# Patient Record
Sex: Female | Born: 1965 | Race: White | Hispanic: No | Marital: Married | Smoking: Former smoker
Health system: Southern US, Community
[De-identification: ages and names within clinical notes are randomized; demographics above are authoritative.]

## PROBLEM LIST (undated history)

## (undated) DIAGNOSIS — Z923 Personal history of irradiation: Secondary | ICD-10-CM

## (undated) DIAGNOSIS — R059 Cough, unspecified: Secondary | ICD-10-CM

## (undated) DIAGNOSIS — Z9221 Personal history of antineoplastic chemotherapy: Secondary | ICD-10-CM

## (undated) DIAGNOSIS — C50919 Malignant neoplasm of unspecified site of unspecified female breast: Secondary | ICD-10-CM

## (undated) DIAGNOSIS — Z789 Other specified health status: Secondary | ICD-10-CM

## (undated) DIAGNOSIS — R05 Cough: Secondary | ICD-10-CM

## (undated) DIAGNOSIS — R0981 Nasal congestion: Secondary | ICD-10-CM

## (undated) HISTORY — DX: Malignant neoplasm of unspecified site of unspecified female breast: C50.919

## (undated) HISTORY — PX: BREAST SURGERY: SHX581

## (undated) HISTORY — DX: Nasal congestion: R09.81

## (undated) HISTORY — DX: Cough, unspecified: R05.9

## (undated) HISTORY — DX: Cough: R05

---

## 1999-04-24 HISTORY — PX: LAPAROSCOPIC ENDOMETRIOSIS FULGURATION: SUR769

## 2007-03-11 ENCOUNTER — Ambulatory Visit (HOSPITAL_COMMUNITY): Admission: RE | Admit: 2007-03-11 | Discharge: 2007-03-11 | Payer: Self-pay | Admitting: Obstetrics and Gynecology

## 2008-03-24 ENCOUNTER — Ambulatory Visit (HOSPITAL_COMMUNITY): Admission: RE | Admit: 2008-03-24 | Discharge: 2008-03-24 | Payer: Self-pay | Admitting: Obstetrics and Gynecology

## 2009-03-29 ENCOUNTER — Ambulatory Visit (HOSPITAL_COMMUNITY): Admission: RE | Admit: 2009-03-29 | Discharge: 2009-03-29 | Payer: Self-pay | Admitting: Obstetrics and Gynecology

## 2009-09-26 ENCOUNTER — Encounter: Admission: RE | Admit: 2009-09-26 | Discharge: 2009-12-25 | Payer: Self-pay | Admitting: Orthopedic Surgery

## 2010-04-23 DIAGNOSIS — C50919 Malignant neoplasm of unspecified site of unspecified female breast: Secondary | ICD-10-CM

## 2010-04-23 DIAGNOSIS — Z923 Personal history of irradiation: Secondary | ICD-10-CM

## 2010-04-23 DIAGNOSIS — Z9221 Personal history of antineoplastic chemotherapy: Secondary | ICD-10-CM

## 2010-04-23 HISTORY — DX: Malignant neoplasm of unspecified site of unspecified female breast: C50.919

## 2010-04-23 HISTORY — DX: Personal history of irradiation: Z92.3

## 2010-04-23 HISTORY — DX: Personal history of antineoplastic chemotherapy: Z92.21

## 2010-04-23 HISTORY — PX: BREAST LUMPECTOMY: SHX2

## 2010-05-09 ENCOUNTER — Ambulatory Visit (HOSPITAL_COMMUNITY): Admission: RE | Admit: 2010-05-09 | Payer: Self-pay | Source: Home / Self Care | Admitting: Obstetrics and Gynecology

## 2010-05-14 ENCOUNTER — Encounter: Payer: Self-pay | Admitting: Obstetrics and Gynecology

## 2010-05-24 ENCOUNTER — Encounter: Payer: Self-pay | Admitting: Obstetrics and Gynecology

## 2010-05-31 ENCOUNTER — Other Ambulatory Visit (HOSPITAL_COMMUNITY): Payer: Self-pay | Admitting: Obstetrics and Gynecology

## 2010-05-31 DIAGNOSIS — Z139 Encounter for screening, unspecified: Secondary | ICD-10-CM

## 2010-06-06 ENCOUNTER — Ambulatory Visit (HOSPITAL_COMMUNITY)
Admission: RE | Admit: 2010-06-06 | Discharge: 2010-06-06 | Disposition: A | Discharge status: Home / Self Care | Payer: BC Managed Care – PPO | Source: Ambulatory Visit | Attending: Obstetrics and Gynecology | Admitting: Obstetrics and Gynecology

## 2010-06-06 DIAGNOSIS — Z1231 Encounter for screening mammogram for malignant neoplasm of breast: Secondary | ICD-10-CM | POA: Insufficient documentation

## 2010-06-06 DIAGNOSIS — Z139 Encounter for screening, unspecified: Secondary | ICD-10-CM

## 2010-06-12 ENCOUNTER — Other Ambulatory Visit: Payer: Self-pay | Admitting: Obstetrics and Gynecology

## 2010-06-12 DIAGNOSIS — R928 Other abnormal and inconclusive findings on diagnostic imaging of breast: Secondary | ICD-10-CM

## 2010-06-14 ENCOUNTER — Other Ambulatory Visit (HOSPITAL_COMMUNITY): Payer: Self-pay | Admitting: Obstetrics and Gynecology

## 2010-06-14 ENCOUNTER — Ambulatory Visit (HOSPITAL_COMMUNITY)
Admission: RE | Admit: 2010-06-14 | Discharge: 2010-06-14 | Disposition: A | Discharge status: Home / Self Care | Payer: BC Managed Care – PPO | Source: Ambulatory Visit | Attending: Obstetrics and Gynecology | Admitting: Obstetrics and Gynecology

## 2010-06-14 DIAGNOSIS — R928 Other abnormal and inconclusive findings on diagnostic imaging of breast: Secondary | ICD-10-CM

## 2010-06-21 ENCOUNTER — Ambulatory Visit (HOSPITAL_COMMUNITY)
Admission: RE | Admit: 2010-06-21 | Discharge: 2010-06-21 | Disposition: A | Discharge status: Home / Self Care | Payer: BC Managed Care – PPO | Source: Ambulatory Visit | Attending: Obstetrics and Gynecology | Admitting: Obstetrics and Gynecology

## 2010-06-21 ENCOUNTER — Other Ambulatory Visit (HOSPITAL_COMMUNITY): Payer: Self-pay | Admitting: Obstetrics and Gynecology

## 2010-06-21 ENCOUNTER — Ambulatory Visit (HOSPITAL_COMMUNITY): Payer: BC Managed Care – PPO

## 2010-06-21 DIAGNOSIS — R928 Other abnormal and inconclusive findings on diagnostic imaging of breast: Secondary | ICD-10-CM

## 2010-06-21 DIAGNOSIS — N6009 Solitary cyst of unspecified breast: Secondary | ICD-10-CM | POA: Insufficient documentation

## 2010-08-01 ENCOUNTER — Other Ambulatory Visit (HOSPITAL_COMMUNITY): Payer: Self-pay | Admitting: Obstetrics and Gynecology

## 2010-08-01 DIAGNOSIS — N63 Unspecified lump in unspecified breast: Secondary | ICD-10-CM

## 2010-08-09 ENCOUNTER — Ambulatory Visit (HOSPITAL_COMMUNITY)
Admission: RE | Admit: 2010-08-09 | Discharge: 2010-08-09 | Disposition: A | Discharge status: Home / Self Care | Payer: BC Managed Care – PPO | Source: Ambulatory Visit | Attending: Obstetrics and Gynecology | Admitting: Obstetrics and Gynecology

## 2010-08-09 ENCOUNTER — Other Ambulatory Visit (HOSPITAL_COMMUNITY): Payer: Self-pay | Admitting: Obstetrics and Gynecology

## 2010-08-09 ENCOUNTER — Other Ambulatory Visit: Payer: Self-pay | Admitting: Radiology

## 2010-08-09 DIAGNOSIS — IMO0002 Reserved for concepts with insufficient information to code with codable children: Secondary | ICD-10-CM

## 2010-08-09 DIAGNOSIS — C50919 Malignant neoplasm of unspecified site of unspecified female breast: Secondary | ICD-10-CM | POA: Insufficient documentation

## 2010-08-09 DIAGNOSIS — N63 Unspecified lump in unspecified breast: Secondary | ICD-10-CM

## 2010-08-10 ENCOUNTER — Other Ambulatory Visit: Payer: Self-pay | Admitting: Obstetrics and Gynecology

## 2010-08-10 ENCOUNTER — Ambulatory Visit
Admission: RE | Admit: 2010-08-10 | Discharge: 2010-08-10 | Disposition: A | Discharge status: Home / Self Care | Payer: BC Managed Care – PPO | Source: Ambulatory Visit | Attending: Obstetrics and Gynecology | Admitting: Obstetrics and Gynecology

## 2010-08-10 DIAGNOSIS — C50912 Malignant neoplasm of unspecified site of left female breast: Secondary | ICD-10-CM

## 2010-08-10 DIAGNOSIS — R928 Other abnormal and inconclusive findings on diagnostic imaging of breast: Secondary | ICD-10-CM

## 2010-08-15 ENCOUNTER — Ambulatory Visit
Admission: RE | Admit: 2010-08-15 | Discharge: 2010-08-15 | Disposition: A | Discharge status: Home / Self Care | Payer: BC Managed Care – PPO | Source: Ambulatory Visit | Attending: Obstetrics and Gynecology | Admitting: Obstetrics and Gynecology

## 2010-08-15 DIAGNOSIS — C50912 Malignant neoplasm of unspecified site of left female breast: Secondary | ICD-10-CM

## 2010-08-15 MED ORDER — GADOBENATE DIMEGLUMINE 529 MG/ML IV SOLN
18.0000 mL | Freq: Once | INTRAVENOUS | Status: AC | PRN
  Administered 2010-08-15: 18 mL via INTRAVENOUS

## 2010-08-16 ENCOUNTER — Encounter (HOSPITAL_BASED_OUTPATIENT_CLINIC_OR_DEPARTMENT_OTHER): Payer: BC Managed Care – PPO | Admitting: Oncology

## 2010-08-16 ENCOUNTER — Other Ambulatory Visit: Payer: Self-pay | Admitting: Oncology

## 2010-08-16 DIAGNOSIS — C50319 Malignant neoplasm of lower-inner quadrant of unspecified female breast: Secondary | ICD-10-CM

## 2010-08-16 LAB — CBC WITH DIFFERENTIAL/PLATELET
BASO%: 0.2 % (ref 0.0–2.0)
EOS%: 2.2 % (ref 0.0–7.0)
HGB: 14.3 g/dL (ref 11.6–15.9)
MCH: 31.3 pg (ref 25.1–34.0)
MCHC: 34.2 g/dL (ref 31.5–36.0)
MCV: 91.5 fL (ref 79.5–101.0)
MONO%: 5.7 % (ref 0.0–14.0)
RBC: 4.56 10*6/uL (ref 3.70–5.45)
RDW: 13.3 % (ref 11.2–14.5)
lymph#: 2.1 10*3/uL (ref 0.9–3.3)

## 2010-08-16 LAB — COMPREHENSIVE METABOLIC PANEL
ALT: 31 U/L (ref 0–35)
AST: 22 U/L (ref 0–37)
Albumin: 4 g/dL (ref 3.5–5.2)
Alkaline Phosphatase: 54 U/L (ref 39–117)
BUN: 10 mg/dL (ref 6–23)
Calcium: 9.2 mg/dL (ref 8.4–10.5)
Chloride: 106 mEq/L (ref 96–112)
Potassium: 4 mEq/L (ref 3.5–5.3)
Sodium: 139 mEq/L (ref 135–145)

## 2010-08-17 DIAGNOSIS — H409 Unspecified glaucoma: Secondary | ICD-10-CM

## 2010-08-17 DIAGNOSIS — Z170 Estrogen receptor positive status [ER+]: Secondary | ICD-10-CM

## 2010-08-17 DIAGNOSIS — C50312 Malignant neoplasm of lower-inner quadrant of left female breast: Secondary | ICD-10-CM | POA: Insufficient documentation

## 2010-08-18 ENCOUNTER — Other Ambulatory Visit: Payer: Self-pay | Admitting: Surgery

## 2010-08-18 ENCOUNTER — Other Ambulatory Visit (HOSPITAL_COMMUNITY): Payer: Self-pay | Admitting: Surgery

## 2010-08-18 DIAGNOSIS — C50919 Malignant neoplasm of unspecified site of unspecified female breast: Secondary | ICD-10-CM

## 2010-08-18 DIAGNOSIS — C50912 Malignant neoplasm of unspecified site of left female breast: Secondary | ICD-10-CM

## 2010-08-24 ENCOUNTER — Other Ambulatory Visit (HOSPITAL_COMMUNITY): Payer: Self-pay | Admitting: Surgery

## 2010-08-24 ENCOUNTER — Encounter (HOSPITAL_COMMUNITY)
Admission: RE | Admit: 2010-08-24 | Discharge: 2010-08-24 | Disposition: A | Discharge status: Home / Self Care | Payer: BC Managed Care – PPO | Source: Ambulatory Visit | Attending: Surgery | Admitting: Surgery

## 2010-08-24 DIAGNOSIS — C50919 Malignant neoplasm of unspecified site of unspecified female breast: Secondary | ICD-10-CM

## 2010-08-24 LAB — CBC
HCT: 44.2 % (ref 36.0–46.0)
Hemoglobin: 15.2 g/dL — ABNORMAL HIGH (ref 12.0–15.0)
MCH: 31 pg (ref 26.0–34.0)
MCHC: 34.4 g/dL (ref 30.0–36.0)
RDW: 12.9 % (ref 11.5–15.5)

## 2010-08-24 LAB — COMPREHENSIVE METABOLIC PANEL
BUN: 12 mg/dL (ref 6–23)
CO2: 26 mEq/L (ref 19–32)
Calcium: 10.1 mg/dL (ref 8.4–10.5)
Chloride: 103 mEq/L (ref 96–112)
Creatinine, Ser: 0.8 mg/dL (ref 0.4–1.2)
GFR calc non Af Amer: >60 mL/min (ref >60)
Total Bilirubin: 0.6 mg/dL (ref 0.3–1.2)

## 2010-08-24 LAB — DIFFERENTIAL
Basophils Absolute: 0 10*3/uL (ref 0.0–0.1)
Basophils Relative: 0 % (ref 0–1)
Eosinophils Relative: 2 % (ref 0–5)
Monocytes Absolute: 0.6 10*3/uL (ref 0.1–1.0)
Monocytes Relative: 7 % (ref 3–12)
Neutro Abs: 5.5 10*3/uL (ref 1.7–7.7)

## 2010-08-24 LAB — HCG, SERUM, QUALITATIVE: Preg, Serum: NEGATIVE

## 2010-08-24 LAB — SURGICAL PCR SCREEN: MRSA, PCR: NEGATIVE

## 2010-08-28 ENCOUNTER — Encounter: Payer: BC Managed Care – PPO | Admitting: Genetic Counselor

## 2010-08-29 ENCOUNTER — Ambulatory Visit
Admission: RE | Admit: 2010-08-29 | Discharge: 2010-08-29 | Disposition: A | Discharge status: Home / Self Care | Payer: BC Managed Care – PPO | Source: Ambulatory Visit | Attending: Surgery | Admitting: Surgery

## 2010-08-29 ENCOUNTER — Other Ambulatory Visit: Payer: Self-pay | Admitting: Surgery

## 2010-08-29 ENCOUNTER — Ambulatory Visit (HOSPITAL_COMMUNITY): Payer: BC Managed Care – PPO

## 2010-08-29 ENCOUNTER — Ambulatory Visit (HOSPITAL_COMMUNITY)
Admission: RE | Admit: 2010-08-29 | Discharge: 2010-08-29 | Disposition: A | Discharge status: Home / Self Care | Payer: BC Managed Care – PPO | Source: Ambulatory Visit | Attending: Surgery | Admitting: Surgery

## 2010-08-29 DIAGNOSIS — C50919 Malignant neoplasm of unspecified site of unspecified female breast: Secondary | ICD-10-CM

## 2010-08-29 DIAGNOSIS — C50912 Malignant neoplasm of unspecified site of left female breast: Secondary | ICD-10-CM

## 2010-08-29 DIAGNOSIS — Z01810 Encounter for preprocedural cardiovascular examination: Secondary | ICD-10-CM | POA: Insufficient documentation

## 2010-08-29 MED ORDER — TECHNETIUM TC 99M SULFUR COLLOID FILTERED
1.0000 | Freq: Once | INTRAVENOUS | Status: AC | PRN
  Administered 2010-08-29: 1 via INTRADERMAL

## 2010-09-01 NOTE — Op Note (Signed)
NAMEKARLEA, MCKIBBIN               ACCOUNT NO.:  [id number]  MEDICAL RECORD NO.:  [id number]           PATIENT TYPE:  LOCATION:                                 FACILITY:  PHYSICIAN:  Clovis Pu. Ameir Faria, M.D.DATE OF BIRTH:  1966/02/27  DATE OF PROCEDURE:  08/29/2010 DATE OF DISCHARGE:                              OPERATIVE REPORT   PREOPERATIVE DIAGNOSIS:  Left breast cancer.  POSTOPERATIVE DIAGNOSIS:  Left breast cancer.  PROCEDURE: 1. Left breast needle-localized partial mastectomy. 2. Left axillary sentinel lymph node mapping. 3. Placement of right subclavian 8-French Power Port-A-Cath with     fluoroscopic guidance.  SURGEON:  Maisie Fus A. Gerrell Tabet, MD.  ANESTHESIA:  LMA with 0.25% Sensorcaine local.  ESTIMATED BLOOD LOSS:  Minimal.  SPECIMENS:  None.  INDICATIONS FOR PROCEDURE:  The patient is a 45 year old female with left breast cancer.  She elected for breast conserving treatment which will require lumpectomy, sentinel lymph node mapping, postop chemotherapy, radiation therapy after being seen in the Multidisciplinary Clinic.  We discussed procedure at great length with her as well as potential treatment options.  Risk of bleeding, infection, arm stiffness and soreness in the left side, swelling of both incisions, potential pneumothorax, hemothorax, pericardial tamponade, catheter migration, arterial puncture, major venous injury, major arterial injury, major nerve injury, perforation esophagus and trachea were all discussed potential risk of Port-A-Cath.  We also discussed catheters can migrate to fragment, become malfunction, become infect and have to be removed as well.  I discussed all the above potential risks of procedures and discussion of options of both operative and nonoperative treatments of this condition.  She agreed to proceed with the above.  She will need postop chemotherapy.  Therefore, Port-A-cath was recommended.  DESCRIPTION OF PROCEDURE:  After  undergoing left breast needle localization, she was seen by Nuclear Medicine in the holding area and left breast was injected with technetium sulfur colloid.  Both sides were appropriately labeled.  The right side was for Port-A-Cath, left side was for lumpectomy, sentinel node.  Questions answered.  She was then taken back to the operating room.  After induction of general esthesia, upper chest region was prepped and draped in sterile fashion. A 4 mL of methylene blue dye were injected in a subareolar position in the left breast for sentinel lymph node mapping.  Time-out was done before this.  She received preoperative antibiotics.  Port-A-Cath was done first.  The patient was placed in Trendelenburg.  Right subclavian vein was cannulated with a needle with return of dark nonpulsatile blood with two attempts.  Wire was fitted through the needle without difficulty and fluoroscopic guidance showed the wire going down the superior vena cava.  We removed the needle.  Small stab incision was made at the wire insertion site.  A 3-cm incision was made below the wire insertion site.  Small pocket was made on the chest wall.  This was done with my finger bluntly.  We then brought an 8-French Power Port catheter on the field.  We attached it, flushed it and then tunneled from the lower incision to the upper incision.  With the  patient back in Trendelenburg, I passed the dilator over the wire moving the wire to-and- fro to dilate the tract.  I then placed the dilator introducer complex advancing it carefully of the wire with fluoroscopic guidance, moving the wire to-and-fro.  Once in position, I removed the wire and dilator leaving the introducer in place with the patient Trendelenburg. Catheter was put to the peel-away sheath and it was peeled away without difficulty.  Tip appeared to be in the mid superior vena cava it look like.  No obvious pneumothorax or hemothorax by fluoroscopy.  I  then secured the catheter to the chest wall with two 2-0 Prolene sutures.  I then drew back on the port, got dark nonpulsatile blood and then flushed it with heparinized saline.  I then put 5 mL of 100 units/mL of heparinized saline within the port.  Lower incision was closed with 3-0 Vicryl and 4-0 Monocryl.  Small stab incision where the catheter was inserted closed with 3-0 Monocryl.  Next, left breast was done.  Lumpectomy was done first.  The tumor in the medial left breast.  Transverse incision was made in the wire toward the nipple.  All tissue around the wires excised.  The anterior margin was closed and I took some additional anterior margin that the tumor seemed quite small.  Cavity was irrigated.  I took the tumor all way down to the pectoralis fascia and took the fascia with leaving the muscle behind.  The NeoProbe was then used to identify the hot spot in left axilla.  Small incision was made in the right axilla.  Dissection was carried down and 4 hot blue sentinel nodes identified and excised. The background counts less than 10% of the actual tumor count which well over 6000.  This cavity was irrigated.  Small piece of Surgicel was placed with excellent hemostasis.  Cavities closed with a layer of 3-0 Vicryl and 4-0 Monocryl.  The lumpectomy cavity was examined, found to be hemostatic after irrigation and closure of the deep layer with 3-0 Vicryl for Monocryl.  Dermabond was applied to all incisions.  All final counts of sponge, needle and instruments were found to be correct at this portion of case.  The patient extubated, taken to recovery room in satisfactory condition with all final counts being correct.     Marrah Vanevery A. Inmer Nix, M.D.     TAC/MEDQ  D:  08/29/2010  T:  08/30/2010  Job:  [id number]  cc:   Drue Second, M.D. Lurline Hare, M.D.  Electronically Signed by Harriette Bouillon M.D. on 09/01/2010 11:16:17 AM

## 2010-09-05 ENCOUNTER — Encounter (HOSPITAL_BASED_OUTPATIENT_CLINIC_OR_DEPARTMENT_OTHER): Payer: BC Managed Care – PPO | Admitting: Oncology

## 2010-09-05 ENCOUNTER — Other Ambulatory Visit: Payer: Self-pay | Admitting: Oncology

## 2010-09-05 DIAGNOSIS — C50319 Malignant neoplasm of lower-inner quadrant of unspecified female breast: Secondary | ICD-10-CM

## 2010-09-05 LAB — COMPREHENSIVE METABOLIC PANEL
Albumin: 4.2 g/dL (ref 3.5–5.2)
CO2: 19 mEq/L (ref 19–32)
Calcium: 9.5 mg/dL (ref 8.4–10.5)
Chloride: 105 mEq/L (ref 96–112)
Glucose, Bld: 102 mg/dL — ABNORMAL HIGH (ref 70–99)
Potassium: 4.1 mEq/L (ref 3.5–5.3)
Sodium: 137 mEq/L (ref 135–145)
Total Bilirubin: 0.6 mg/dL (ref 0.3–1.2)
Total Protein: 6.7 g/dL (ref 6.0–8.3)

## 2010-09-05 LAB — CBC WITH DIFFERENTIAL/PLATELET
Basophils Absolute: 0 10*3/uL (ref 0.0–0.1)
Eosinophils Absolute: 0.2 10*3/uL (ref 0.0–0.5)
HGB: 14.1 g/dL (ref 11.6–15.9)
MCV: 89.2 fL (ref 79.5–101.0)
MONO#: 0.7 10*3/uL (ref 0.1–0.9)
MONO%: 8.6 % (ref 0.0–14.0)
NEUT#: 4.9 10*3/uL (ref 1.5–6.5)
RDW: 13 % (ref 11.2–14.5)

## 2010-09-11 ENCOUNTER — Encounter: Payer: BC Managed Care – PPO | Admitting: Genetic Counselor

## 2010-09-14 ENCOUNTER — Ambulatory Visit (HOSPITAL_COMMUNITY): Payer: BC Managed Care – PPO

## 2010-09-27 ENCOUNTER — Other Ambulatory Visit (HOSPITAL_COMMUNITY): Payer: BC Managed Care – PPO

## 2010-10-03 ENCOUNTER — Other Ambulatory Visit (HOSPITAL_COMMUNITY): Payer: BC Managed Care – PPO

## 2010-10-05 ENCOUNTER — Ambulatory Visit (HOSPITAL_COMMUNITY)
Admission: RE | Admit: 2010-10-05 | Discharge: 2010-10-05 | Disposition: A | Discharge status: Home / Self Care | Payer: BC Managed Care – PPO | Source: Ambulatory Visit | Attending: Oncology | Admitting: Oncology

## 2010-10-05 DIAGNOSIS — Z5111 Encounter for antineoplastic chemotherapy: Secondary | ICD-10-CM

## 2010-10-05 DIAGNOSIS — Z01818 Encounter for other preprocedural examination: Secondary | ICD-10-CM | POA: Insufficient documentation

## 2010-10-05 DIAGNOSIS — C50919 Malignant neoplasm of unspecified site of unspecified female breast: Secondary | ICD-10-CM | POA: Insufficient documentation

## 2010-10-10 ENCOUNTER — Encounter (HOSPITAL_BASED_OUTPATIENT_CLINIC_OR_DEPARTMENT_OTHER): Payer: BC Managed Care – PPO | Admitting: Oncology

## 2010-10-10 ENCOUNTER — Other Ambulatory Visit: Payer: Self-pay | Admitting: Oncology

## 2010-10-10 DIAGNOSIS — Z170 Estrogen receptor positive status [ER+]: Secondary | ICD-10-CM

## 2010-10-10 DIAGNOSIS — Z5111 Encounter for antineoplastic chemotherapy: Secondary | ICD-10-CM

## 2010-10-10 DIAGNOSIS — C50319 Malignant neoplasm of lower-inner quadrant of unspecified female breast: Secondary | ICD-10-CM

## 2010-10-10 DIAGNOSIS — G4700 Insomnia, unspecified: Secondary | ICD-10-CM

## 2010-10-10 LAB — COMPREHENSIVE METABOLIC PANEL
ALT: 34 U/L (ref 0–35)
AST: 22 U/L (ref 0–37)
Albumin: 4.3 g/dL (ref 3.5–5.2)
Alkaline Phosphatase: 59 U/L (ref 39–117)
BUN: 11 mg/dL (ref 6–23)
CO2: 18 mEq/L — ABNORMAL LOW (ref 19–32)
Calcium: 9.1 mg/dL (ref 8.4–10.5)
Chloride: 108 mEq/L (ref 96–112)
Creatinine, Ser: 0.81 mg/dL (ref 0.50–1.10)
Glucose, Bld: 98 mg/dL (ref 70–99)
Potassium: 4.1 mEq/L (ref 3.5–5.3)
Sodium: 137 mEq/L (ref 135–145)
Total Bilirubin: 0.4 mg/dL (ref 0.3–1.2)
Total Protein: 6.9 g/dL (ref 6.0–8.3)

## 2010-10-10 LAB — CBC WITH DIFFERENTIAL/PLATELET
Eosinophils Absolute: 0.2 10*3/uL (ref 0.0–0.5)
MCHC: 34.1 g/dL (ref 31.5–36.0)
MONO#: 0.6 10*3/uL (ref 0.1–0.9)
NEUT#: 3.9 10*3/uL (ref 1.5–6.5)
RBC: 4.61 10*6/uL (ref 3.70–5.45)
RDW: 13.3 % (ref 11.2–14.5)
WBC: 6.4 10*3/uL (ref 3.9–10.3)

## 2010-10-11 ENCOUNTER — Encounter (HOSPITAL_BASED_OUTPATIENT_CLINIC_OR_DEPARTMENT_OTHER): Payer: BC Managed Care – PPO | Admitting: Oncology

## 2010-10-11 DIAGNOSIS — Z170 Estrogen receptor positive status [ER+]: Secondary | ICD-10-CM

## 2010-10-11 DIAGNOSIS — Z5111 Encounter for antineoplastic chemotherapy: Secondary | ICD-10-CM

## 2010-10-11 DIAGNOSIS — C50319 Malignant neoplasm of lower-inner quadrant of unspecified female breast: Secondary | ICD-10-CM

## 2010-10-17 ENCOUNTER — Other Ambulatory Visit: Payer: Self-pay | Admitting: Oncology

## 2010-10-17 ENCOUNTER — Encounter (HOSPITAL_BASED_OUTPATIENT_CLINIC_OR_DEPARTMENT_OTHER): Payer: BC Managed Care – PPO | Admitting: Oncology

## 2010-10-17 DIAGNOSIS — Z170 Estrogen receptor positive status [ER+]: Secondary | ICD-10-CM

## 2010-10-17 DIAGNOSIS — C50319 Malignant neoplasm of lower-inner quadrant of unspecified female breast: Secondary | ICD-10-CM

## 2010-10-17 DIAGNOSIS — D0590 Unspecified type of carcinoma in situ of unspecified breast: Secondary | ICD-10-CM

## 2010-10-17 DIAGNOSIS — H409 Unspecified glaucoma: Secondary | ICD-10-CM

## 2010-10-17 LAB — CBC WITH DIFFERENTIAL/PLATELET
Basophils Absolute: 0 10*3/uL (ref 0.0–0.1)
EOS%: 6.9 % (ref 0.0–7.0)
HCT: 36.1 % (ref 34.8–46.6)
HGB: 12.7 g/dL (ref 11.6–15.9)
MCH: 31.8 pg (ref 25.1–34.0)
MONO#: 0.1 10*3/uL (ref 0.1–0.9)
NEUT%: 13 % — ABNORMAL LOW (ref 38.4–76.8)
lymph#: 0.9 10*3/uL (ref 0.9–3.3)

## 2010-10-17 LAB — BASIC METABOLIC PANEL
BUN: 12 mg/dL (ref 6–23)
CO2: 21 mEq/L (ref 19–32)
Chloride: 106 mEq/L (ref 96–112)
Creatinine, Ser: 0.75 mg/dL (ref 0.50–1.10)

## 2010-10-24 ENCOUNTER — Encounter (HOSPITAL_BASED_OUTPATIENT_CLINIC_OR_DEPARTMENT_OTHER): Payer: BC Managed Care – PPO | Admitting: Oncology

## 2010-10-24 ENCOUNTER — Other Ambulatory Visit: Payer: Self-pay | Admitting: Oncology

## 2010-10-24 DIAGNOSIS — H409 Unspecified glaucoma: Secondary | ICD-10-CM

## 2010-10-24 DIAGNOSIS — C50319 Malignant neoplasm of lower-inner quadrant of unspecified female breast: Secondary | ICD-10-CM

## 2010-10-24 DIAGNOSIS — Z5111 Encounter for antineoplastic chemotherapy: Secondary | ICD-10-CM

## 2010-10-24 DIAGNOSIS — Z170 Estrogen receptor positive status [ER+]: Secondary | ICD-10-CM

## 2010-10-24 LAB — CBC WITH DIFFERENTIAL/PLATELET
EOS%: 0.4 % (ref 0.0–7.0)
MCH: 31.5 pg (ref 25.1–34.0)
MCHC: 34.6 g/dL (ref 31.5–36.0)
MCV: 91.1 fL (ref 79.5–101.0)
MONO%: 1.4 % (ref 0.0–14.0)
RBC: 4.13 10*6/uL (ref 3.70–5.45)
RDW: 13.3 % (ref 11.2–14.5)

## 2010-10-24 LAB — COMPREHENSIVE METABOLIC PANEL
AST: 14 U/L (ref 0–37)
Albumin: 4.2 g/dL (ref 3.5–5.2)
Alkaline Phosphatase: 74 U/L (ref 39–117)
BUN: 12 mg/dL (ref 6–23)
Potassium: 4.1 mEq/L (ref 3.5–5.3)
Sodium: 139 mEq/L (ref 135–145)
Total Protein: 6.5 g/dL (ref 6.0–8.3)

## 2010-10-25 ENCOUNTER — Encounter (HOSPITAL_BASED_OUTPATIENT_CLINIC_OR_DEPARTMENT_OTHER): Payer: BC Managed Care – PPO | Admitting: Oncology

## 2010-10-25 DIAGNOSIS — Z5111 Encounter for antineoplastic chemotherapy: Secondary | ICD-10-CM

## 2010-10-25 DIAGNOSIS — C50319 Malignant neoplasm of lower-inner quadrant of unspecified female breast: Secondary | ICD-10-CM

## 2010-10-25 DIAGNOSIS — D702 Other drug-induced agranulocytosis: Secondary | ICD-10-CM

## 2010-10-31 ENCOUNTER — Encounter (HOSPITAL_BASED_OUTPATIENT_CLINIC_OR_DEPARTMENT_OTHER): Payer: BC Managed Care – PPO | Admitting: Oncology

## 2010-10-31 ENCOUNTER — Other Ambulatory Visit: Payer: Self-pay | Admitting: Oncology

## 2010-10-31 DIAGNOSIS — H409 Unspecified glaucoma: Secondary | ICD-10-CM

## 2010-10-31 DIAGNOSIS — C50319 Malignant neoplasm of lower-inner quadrant of unspecified female breast: Secondary | ICD-10-CM

## 2010-10-31 DIAGNOSIS — Z170 Estrogen receptor positive status [ER+]: Secondary | ICD-10-CM

## 2010-10-31 LAB — BASIC METABOLIC PANEL
BUN: 12 mg/dL (ref 6–23)
Calcium: 9.5 mg/dL (ref 8.4–10.5)
Creatinine, Ser: 0.8 mg/dL (ref 0.50–1.10)
Potassium: 4.2 mEq/L (ref 3.5–5.3)

## 2010-10-31 LAB — CBC WITH DIFFERENTIAL/PLATELET
Basophils Absolute: 0.1 10*3/uL (ref 0.0–0.1)
EOS%: 1 % (ref 0.0–7.0)
HGB: 12.3 g/dL (ref 11.6–15.9)
LYMPH%: 67.3 % — ABNORMAL HIGH (ref 14.0–49.7)
MCH: 30.2 pg (ref 25.1–34.0)
MCV: 88.9 fL (ref 79.5–101.0)
MONO%: 11.5 % (ref 0.0–14.0)
Platelets: 210 10*3/uL (ref 145–400)
RDW: 12.9 % (ref 11.2–14.5)

## 2010-11-07 ENCOUNTER — Other Ambulatory Visit: Payer: Self-pay | Admitting: Oncology

## 2010-11-07 ENCOUNTER — Encounter (HOSPITAL_BASED_OUTPATIENT_CLINIC_OR_DEPARTMENT_OTHER): Payer: BC Managed Care – PPO | Admitting: Oncology

## 2010-11-07 DIAGNOSIS — Z170 Estrogen receptor positive status [ER+]: Secondary | ICD-10-CM

## 2010-11-07 DIAGNOSIS — H409 Unspecified glaucoma: Secondary | ICD-10-CM

## 2010-11-07 DIAGNOSIS — C50319 Malignant neoplasm of lower-inner quadrant of unspecified female breast: Secondary | ICD-10-CM

## 2010-11-07 DIAGNOSIS — Z5111 Encounter for antineoplastic chemotherapy: Secondary | ICD-10-CM

## 2010-11-07 DIAGNOSIS — H10239 Serous conjunctivitis, except viral, unspecified eye: Secondary | ICD-10-CM

## 2010-11-07 LAB — CBC WITH DIFFERENTIAL/PLATELET
BASO%: 0.6 % (ref 0.0–2.0)
Eosinophils Absolute: 0 10*3/uL (ref 0.0–0.5)
MONO#: 1.1 10*3/uL — ABNORMAL HIGH (ref 0.1–0.9)
NEUT#: 5.8 10*3/uL (ref 1.5–6.5)
Platelets: 197 10*3/uL (ref 145–400)
RBC: 4.27 10*6/uL (ref 3.70–5.45)
RDW: 13.6 % (ref 11.2–14.5)
WBC: 8.3 10*3/uL (ref 3.9–10.3)
lymph#: 1.4 10*3/uL (ref 0.9–3.3)

## 2010-11-07 LAB — COMPREHENSIVE METABOLIC PANEL
ALT: 30 U/L (ref 0–35)
Albumin: 4.1 g/dL (ref 3.5–5.2)
CO2: 21 mEq/L (ref 19–32)
Chloride: 106 mEq/L (ref 96–112)
Glucose, Bld: 90 mg/dL (ref 70–99)
Potassium: 4 mEq/L (ref 3.5–5.3)
Sodium: 138 mEq/L (ref 135–145)
Total Protein: 6.8 g/dL (ref 6.0–8.3)

## 2010-11-08 ENCOUNTER — Encounter (HOSPITAL_BASED_OUTPATIENT_CLINIC_OR_DEPARTMENT_OTHER): Payer: BC Managed Care – PPO | Admitting: Oncology

## 2010-11-08 DIAGNOSIS — C50319 Malignant neoplasm of lower-inner quadrant of unspecified female breast: Secondary | ICD-10-CM

## 2010-11-08 DIAGNOSIS — Z5189 Encounter for other specified aftercare: Secondary | ICD-10-CM

## 2010-11-14 ENCOUNTER — Other Ambulatory Visit: Payer: Self-pay | Admitting: Oncology

## 2010-11-14 ENCOUNTER — Encounter (HOSPITAL_BASED_OUTPATIENT_CLINIC_OR_DEPARTMENT_OTHER): Payer: BC Managed Care – PPO | Admitting: Oncology

## 2010-11-14 ENCOUNTER — Ambulatory Visit (HOSPITAL_COMMUNITY)
Admission: RE | Admit: 2010-11-14 | Discharge: 2010-11-14 | Disposition: A | Discharge status: Home / Self Care | Payer: BC Managed Care – PPO | Source: Ambulatory Visit | Attending: Oncology | Admitting: Oncology

## 2010-11-14 DIAGNOSIS — H409 Unspecified glaucoma: Secondary | ICD-10-CM

## 2010-11-14 DIAGNOSIS — Z170 Estrogen receptor positive status [ER+]: Secondary | ICD-10-CM

## 2010-11-14 DIAGNOSIS — H10239 Serous conjunctivitis, except viral, unspecified eye: Secondary | ICD-10-CM

## 2010-11-14 DIAGNOSIS — Z95828 Presence of other vascular implants and grafts: Secondary | ICD-10-CM

## 2010-11-14 DIAGNOSIS — C50919 Malignant neoplasm of unspecified site of unspecified female breast: Secondary | ICD-10-CM | POA: Insufficient documentation

## 2010-11-14 DIAGNOSIS — C50319 Malignant neoplasm of lower-inner quadrant of unspecified female breast: Secondary | ICD-10-CM

## 2010-11-14 DIAGNOSIS — Z452 Encounter for adjustment and management of vascular access device: Secondary | ICD-10-CM | POA: Insufficient documentation

## 2010-11-14 LAB — CBC WITH DIFFERENTIAL/PLATELET
BASO%: 2.5 % — ABNORMAL HIGH (ref 0.0–2.0)
Eosinophils Absolute: 0 10*3/uL (ref 0.0–0.5)
LYMPH%: 37.6 % (ref 14.0–49.7)
MCHC: 35.2 g/dL (ref 31.5–36.0)
MONO#: 0.1 10*3/uL (ref 0.1–0.9)
MONO%: 6.2 % (ref 0.0–14.0)
NEUT#: 0.8 10*3/uL — ABNORMAL LOW (ref 1.5–6.5)
RBC: 3.72 10*6/uL (ref 3.70–5.45)
RDW: 13.6 % (ref 11.2–14.5)
WBC: 1.6 10*3/uL — ABNORMAL LOW (ref 3.9–10.3)

## 2010-11-14 LAB — BASIC METABOLIC PANEL
Calcium: 9.7 mg/dL (ref 8.4–10.5)
Glucose, Bld: 97 mg/dL (ref 70–99)
Potassium: 4.3 mEq/L (ref 3.5–5.3)
Sodium: 141 mEq/L (ref 135–145)

## 2010-11-21 ENCOUNTER — Other Ambulatory Visit: Payer: Self-pay | Admitting: Oncology

## 2010-11-21 ENCOUNTER — Encounter (HOSPITAL_BASED_OUTPATIENT_CLINIC_OR_DEPARTMENT_OTHER): Payer: BC Managed Care – PPO

## 2010-11-21 DIAGNOSIS — H409 Unspecified glaucoma: Secondary | ICD-10-CM

## 2010-11-21 DIAGNOSIS — H10239 Serous conjunctivitis, except viral, unspecified eye: Secondary | ICD-10-CM

## 2010-11-21 DIAGNOSIS — C50319 Malignant neoplasm of lower-inner quadrant of unspecified female breast: Secondary | ICD-10-CM

## 2010-11-21 DIAGNOSIS — Z170 Estrogen receptor positive status [ER+]: Secondary | ICD-10-CM

## 2010-11-21 DIAGNOSIS — Z5111 Encounter for antineoplastic chemotherapy: Secondary | ICD-10-CM

## 2010-11-21 LAB — CBC WITH DIFFERENTIAL (CANCER CENTER ONLY)
BASO#: 0.1 10*3/uL (ref 0.0–0.2)
Eosinophils Absolute: 0 10*3/uL (ref 0.0–0.5)
HCT: 36.9 % (ref 34.8–46.6)
HGB: 12.9 g/dL (ref 11.6–15.9)
LYMPH#: 1.5 10*3/uL (ref 0.9–3.3)
LYMPH%: 14.1 % (ref 14.0–48.0)
MCV: 91 fL (ref 81–101)
MONO#: 1 10*3/uL — ABNORMAL HIGH (ref 0.1–0.9)
NEUT%: 75.5 % (ref 39.6–80.0)
RDW: 14.9 % (ref 11.1–15.7)
WBC: 10.3 10*3/uL — ABNORMAL HIGH (ref 3.9–10.0)

## 2010-11-21 LAB — COMPREHENSIVE METABOLIC PANEL
ALT: 30 U/L (ref 0–35)
BUN: 11 mg/dL (ref 6–23)
CO2: 21 mEq/L (ref 19–32)
Calcium: 9.5 mg/dL (ref 8.4–10.5)
Chloride: 105 mEq/L (ref 96–112)
Creatinine, Ser: 0.77 mg/dL (ref 0.50–1.10)
Glucose, Bld: 95 mg/dL (ref 70–99)
Total Bilirubin: 0.3 mg/dL (ref 0.3–1.2)

## 2010-11-22 ENCOUNTER — Encounter (HOSPITAL_BASED_OUTPATIENT_CLINIC_OR_DEPARTMENT_OTHER): Payer: BC Managed Care – PPO | Admitting: Oncology

## 2010-11-22 DIAGNOSIS — Z5189 Encounter for other specified aftercare: Secondary | ICD-10-CM

## 2010-11-22 DIAGNOSIS — C50319 Malignant neoplasm of lower-inner quadrant of unspecified female breast: Secondary | ICD-10-CM

## 2010-11-28 ENCOUNTER — Other Ambulatory Visit: Payer: Self-pay | Admitting: Oncology

## 2010-11-28 ENCOUNTER — Encounter (HOSPITAL_BASED_OUTPATIENT_CLINIC_OR_DEPARTMENT_OTHER): Payer: BC Managed Care – PPO | Admitting: Oncology

## 2010-11-28 DIAGNOSIS — C50319 Malignant neoplasm of lower-inner quadrant of unspecified female breast: Secondary | ICD-10-CM

## 2010-11-28 DIAGNOSIS — H409 Unspecified glaucoma: Secondary | ICD-10-CM

## 2010-11-28 DIAGNOSIS — H10239 Serous conjunctivitis, except viral, unspecified eye: Secondary | ICD-10-CM

## 2010-11-28 DIAGNOSIS — Z170 Estrogen receptor positive status [ER+]: Secondary | ICD-10-CM

## 2010-11-28 LAB — CBC WITH DIFFERENTIAL/PLATELET
Basophils Absolute: 0 10*3/uL (ref 0.0–0.1)
EOS%: 0.5 % (ref 0.0–7.0)
HCT: 32.5 % — ABNORMAL LOW (ref 34.8–46.6)
HGB: 10.8 g/dL — ABNORMAL LOW (ref 11.6–15.9)
LYMPH%: 23 % (ref 14.0–49.7)
MCH: 30.7 pg (ref 25.1–34.0)
NEUT%: 69.3 % (ref 38.4–76.8)
Platelets: 174 10*3/uL (ref 145–400)
lymph#: 0.5 10*3/uL — ABNORMAL LOW (ref 0.9–3.3)

## 2010-11-29 LAB — BASIC METABOLIC PANEL
BUN: 9 mg/dL (ref 6–23)
CO2: 24 mEq/L (ref 19–32)
Chloride: 105 mEq/L (ref 96–112)
Creatinine, Ser: 0.69 mg/dL (ref 0.50–1.10)

## 2010-12-11 ENCOUNTER — Encounter (HOSPITAL_BASED_OUTPATIENT_CLINIC_OR_DEPARTMENT_OTHER): Payer: BC Managed Care – PPO | Admitting: Oncology

## 2010-12-11 ENCOUNTER — Other Ambulatory Visit: Payer: Self-pay | Admitting: Oncology

## 2010-12-11 DIAGNOSIS — Z170 Estrogen receptor positive status [ER+]: Secondary | ICD-10-CM

## 2010-12-11 DIAGNOSIS — H409 Unspecified glaucoma: Secondary | ICD-10-CM

## 2010-12-11 DIAGNOSIS — Z5111 Encounter for antineoplastic chemotherapy: Secondary | ICD-10-CM

## 2010-12-11 DIAGNOSIS — H10239 Serous conjunctivitis, except viral, unspecified eye: Secondary | ICD-10-CM

## 2010-12-11 DIAGNOSIS — C50319 Malignant neoplasm of lower-inner quadrant of unspecified female breast: Secondary | ICD-10-CM

## 2010-12-11 LAB — CBC WITH DIFFERENTIAL/PLATELET
EOS%: 0.5 % (ref 0.0–7.0)
MCH: 32.6 pg (ref 25.1–34.0)
MCV: 94 fL (ref 79.5–101.0)
MONO%: 12.5 % (ref 0.0–14.0)
NEUT#: 4.6 10*3/uL (ref 1.5–6.5)
RBC: 3.78 10*6/uL (ref 3.70–5.45)
RDW: 18.6 % — ABNORMAL HIGH (ref 11.2–14.5)

## 2010-12-11 LAB — COMPREHENSIVE METABOLIC PANEL
AST: 37 U/L (ref 0–37)
Albumin: 4.3 g/dL (ref 3.5–5.2)
Alkaline Phosphatase: 80 U/L (ref 39–117)
Potassium: 4.2 mEq/L (ref 3.5–5.3)
Sodium: 139 mEq/L (ref 135–145)
Total Protein: 7 g/dL (ref 6.0–8.3)

## 2010-12-18 ENCOUNTER — Encounter (HOSPITAL_BASED_OUTPATIENT_CLINIC_OR_DEPARTMENT_OTHER): Payer: BC Managed Care – PPO | Admitting: Oncology

## 2010-12-18 ENCOUNTER — Other Ambulatory Visit: Payer: Self-pay | Admitting: Physician Assistant

## 2010-12-18 DIAGNOSIS — H409 Unspecified glaucoma: Secondary | ICD-10-CM

## 2010-12-18 DIAGNOSIS — Z170 Estrogen receptor positive status [ER+]: Secondary | ICD-10-CM

## 2010-12-18 DIAGNOSIS — H10239 Serous conjunctivitis, except viral, unspecified eye: Secondary | ICD-10-CM

## 2010-12-18 DIAGNOSIS — Z5111 Encounter for antineoplastic chemotherapy: Secondary | ICD-10-CM

## 2010-12-18 DIAGNOSIS — C50319 Malignant neoplasm of lower-inner quadrant of unspecified female breast: Secondary | ICD-10-CM

## 2010-12-18 LAB — CBC WITH DIFFERENTIAL/PLATELET
Basophils Absolute: 0.1 10*3/uL (ref 0.0–0.1)
EOS%: 2.9 % (ref 0.0–7.0)
HGB: 11.1 g/dL — ABNORMAL LOW (ref 11.6–15.9)
MCH: 31.5 pg (ref 25.1–34.0)
MCV: 92.9 fL (ref 79.5–101.0)
MONO%: 9.1 % (ref 0.0–14.0)
RBC: 3.52 10*6/uL — ABNORMAL LOW (ref 3.70–5.45)
RDW: 16.8 % — ABNORMAL HIGH (ref 11.2–14.5)

## 2010-12-18 LAB — COMPREHENSIVE METABOLIC PANEL
Alkaline Phosphatase: 74 U/L (ref 39–117)
Glucose, Bld: 105 mg/dL — ABNORMAL HIGH (ref 70–99)
Sodium: 140 mEq/L (ref 135–145)
Total Bilirubin: 0.5 mg/dL (ref 0.3–1.2)
Total Protein: 6.6 g/dL (ref 6.0–8.3)

## 2011-01-01 ENCOUNTER — Other Ambulatory Visit: Payer: Self-pay | Admitting: Oncology

## 2011-01-01 ENCOUNTER — Encounter (HOSPITAL_BASED_OUTPATIENT_CLINIC_OR_DEPARTMENT_OTHER): Payer: BC Managed Care – PPO | Admitting: Oncology

## 2011-01-01 DIAGNOSIS — Z5111 Encounter for antineoplastic chemotherapy: Secondary | ICD-10-CM

## 2011-01-01 DIAGNOSIS — H10239 Serous conjunctivitis, except viral, unspecified eye: Secondary | ICD-10-CM

## 2011-01-01 DIAGNOSIS — Z170 Estrogen receptor positive status [ER+]: Secondary | ICD-10-CM

## 2011-01-01 DIAGNOSIS — C50319 Malignant neoplasm of lower-inner quadrant of unspecified female breast: Secondary | ICD-10-CM

## 2011-01-01 DIAGNOSIS — H409 Unspecified glaucoma: Secondary | ICD-10-CM

## 2011-01-01 LAB — CBC WITH DIFFERENTIAL/PLATELET
BASO%: 0.4 % (ref 0.0–2.0)
EOS%: 4.2 % (ref 0.0–7.0)
HCT: 34.6 % — ABNORMAL LOW (ref 34.8–46.6)
LYMPH%: 13.6 % — ABNORMAL LOW (ref 14.0–49.7)
MCH: 32.2 pg (ref 25.1–34.0)
MCHC: 34.1 g/dL (ref 31.5–36.0)
MONO%: 9.3 % (ref 0.0–14.0)
NEUT%: 72.5 % (ref 38.4–76.8)
Platelets: 283 10*3/uL (ref 145–400)

## 2011-01-01 LAB — BASIC METABOLIC PANEL
Calcium: 9.5 mg/dL (ref 8.4–10.5)
Creatinine, Ser: 0.79 mg/dL (ref 0.50–1.10)

## 2011-01-08 ENCOUNTER — Other Ambulatory Visit: Payer: Self-pay | Admitting: Oncology

## 2011-01-08 ENCOUNTER — Encounter (HOSPITAL_BASED_OUTPATIENT_CLINIC_OR_DEPARTMENT_OTHER): Payer: BC Managed Care – PPO | Admitting: Oncology

## 2011-01-08 DIAGNOSIS — H10239 Serous conjunctivitis, except viral, unspecified eye: Secondary | ICD-10-CM

## 2011-01-08 DIAGNOSIS — Z170 Estrogen receptor positive status [ER+]: Secondary | ICD-10-CM

## 2011-01-08 DIAGNOSIS — Z5111 Encounter for antineoplastic chemotherapy: Secondary | ICD-10-CM

## 2011-01-08 DIAGNOSIS — H409 Unspecified glaucoma: Secondary | ICD-10-CM

## 2011-01-08 DIAGNOSIS — C50319 Malignant neoplasm of lower-inner quadrant of unspecified female breast: Secondary | ICD-10-CM

## 2011-01-08 LAB — CBC WITH DIFFERENTIAL/PLATELET
Basophils Absolute: 0 10*3/uL (ref 0.0–0.1)
EOS%: 4.9 % (ref 0.0–7.0)
Eosinophils Absolute: 0.3 10*3/uL (ref 0.0–0.5)
HGB: 11.5 g/dL — ABNORMAL LOW (ref 11.6–15.9)
NEUT#: 4.2 10*3/uL (ref 1.5–6.5)
RDW: 14.8 % — ABNORMAL HIGH (ref 11.2–14.5)
lymph#: 0.9 10*3/uL (ref 0.9–3.3)

## 2011-01-08 LAB — COMPREHENSIVE METABOLIC PANEL
AST: 32 U/L (ref 0–37)
Albumin: 3.9 g/dL (ref 3.5–5.2)
BUN: 9 mg/dL (ref 6–23)
Calcium: 9.3 mg/dL (ref 8.4–10.5)
Chloride: 108 mEq/L (ref 96–112)
Potassium: 3.8 mEq/L (ref 3.5–5.3)
Sodium: 141 mEq/L (ref 135–145)
Total Protein: 6.1 g/dL (ref 6.0–8.3)

## 2011-01-15 ENCOUNTER — Other Ambulatory Visit: Payer: Self-pay | Admitting: Oncology

## 2011-01-15 ENCOUNTER — Encounter (HOSPITAL_BASED_OUTPATIENT_CLINIC_OR_DEPARTMENT_OTHER): Payer: BC Managed Care – PPO | Admitting: Oncology

## 2011-01-15 DIAGNOSIS — H10239 Serous conjunctivitis, except viral, unspecified eye: Secondary | ICD-10-CM

## 2011-01-15 DIAGNOSIS — Z5111 Encounter for antineoplastic chemotherapy: Secondary | ICD-10-CM

## 2011-01-15 DIAGNOSIS — C50319 Malignant neoplasm of lower-inner quadrant of unspecified female breast: Secondary | ICD-10-CM

## 2011-01-15 DIAGNOSIS — H409 Unspecified glaucoma: Secondary | ICD-10-CM

## 2011-01-15 DIAGNOSIS — Z170 Estrogen receptor positive status [ER+]: Secondary | ICD-10-CM

## 2011-01-15 LAB — CBC WITH DIFFERENTIAL/PLATELET
BASO%: 0.4 % (ref 0.0–2.0)
Basophils Absolute: 0 10*3/uL (ref 0.0–0.1)
EOS%: 4.1 % (ref 0.0–7.0)
HGB: 11 g/dL — ABNORMAL LOW (ref 11.6–15.9)
MCH: 32.5 pg (ref 25.1–34.0)
MCHC: 34.1 g/dL (ref 31.5–36.0)
MONO%: 7.3 % (ref 0.0–14.0)
RBC: 3.38 10*6/uL — ABNORMAL LOW (ref 3.70–5.45)
RDW: 14.9 % — ABNORMAL HIGH (ref 11.2–14.5)
lymph#: 0.9 10*3/uL (ref 0.9–3.3)

## 2011-01-15 LAB — BASIC METABOLIC PANEL
Chloride: 107 mEq/L (ref 96–112)
Potassium: 4.1 mEq/L (ref 3.5–5.3)
Sodium: 142 mEq/L (ref 135–145)

## 2011-01-22 ENCOUNTER — Other Ambulatory Visit: Payer: Self-pay | Admitting: Oncology

## 2011-01-22 ENCOUNTER — Encounter (HOSPITAL_BASED_OUTPATIENT_CLINIC_OR_DEPARTMENT_OTHER): Payer: BC Managed Care – PPO | Admitting: Oncology

## 2011-01-22 DIAGNOSIS — C50319 Malignant neoplasm of lower-inner quadrant of unspecified female breast: Secondary | ICD-10-CM

## 2011-01-22 DIAGNOSIS — H10239 Serous conjunctivitis, except viral, unspecified eye: Secondary | ICD-10-CM

## 2011-01-22 DIAGNOSIS — Z5111 Encounter for antineoplastic chemotherapy: Secondary | ICD-10-CM

## 2011-01-22 DIAGNOSIS — Z170 Estrogen receptor positive status [ER+]: Secondary | ICD-10-CM

## 2011-01-22 DIAGNOSIS — H409 Unspecified glaucoma: Secondary | ICD-10-CM

## 2011-01-22 LAB — BASIC METABOLIC PANEL
CO2: 22 mEq/L (ref 19–32)
Chloride: 109 mEq/L (ref 96–112)
Creatinine, Ser: 0.82 mg/dL (ref 0.50–1.10)
Potassium: 4 mEq/L (ref 3.5–5.3)

## 2011-01-22 LAB — CBC WITH DIFFERENTIAL/PLATELET
BASO%: 1 % (ref 0.0–2.0)
EOS%: 5.5 % (ref 0.0–7.0)
HCT: 32.8 % — ABNORMAL LOW (ref 34.8–46.6)
HGB: 11.6 g/dL (ref 11.6–15.9)
MCH: 34.1 pg — ABNORMAL HIGH (ref 25.1–34.0)
MCHC: 35.3 g/dL (ref 31.5–36.0)
MONO#: 0.4 10*3/uL (ref 0.1–0.9)
NEUT%: 67.3 % (ref 38.4–76.8)
RDW: 15.2 % — ABNORMAL HIGH (ref 11.2–14.5)
WBC: 4.7 10*3/uL (ref 3.9–10.3)
lymph#: 0.8 10*3/uL — ABNORMAL LOW (ref 0.9–3.3)

## 2011-01-25 ENCOUNTER — Encounter: Payer: Self-pay | Admitting: *Deleted

## 2011-01-29 ENCOUNTER — Other Ambulatory Visit: Payer: Self-pay | Admitting: Oncology

## 2011-01-29 ENCOUNTER — Encounter (HOSPITAL_BASED_OUTPATIENT_CLINIC_OR_DEPARTMENT_OTHER): Payer: BC Managed Care – PPO | Admitting: Oncology

## 2011-01-29 DIAGNOSIS — H409 Unspecified glaucoma: Secondary | ICD-10-CM

## 2011-01-29 DIAGNOSIS — Z5111 Encounter for antineoplastic chemotherapy: Secondary | ICD-10-CM

## 2011-01-29 DIAGNOSIS — Z170 Estrogen receptor positive status [ER+]: Secondary | ICD-10-CM

## 2011-01-29 DIAGNOSIS — C50319 Malignant neoplasm of lower-inner quadrant of unspecified female breast: Secondary | ICD-10-CM

## 2011-01-29 DIAGNOSIS — H10239 Serous conjunctivitis, except viral, unspecified eye: Secondary | ICD-10-CM

## 2011-01-29 DIAGNOSIS — R21 Rash and other nonspecific skin eruption: Secondary | ICD-10-CM

## 2011-01-29 LAB — COMPREHENSIVE METABOLIC PANEL
ALT: 35 U/L (ref 0–35)
Albumin: 4 g/dL (ref 3.5–5.2)
Alkaline Phosphatase: 62 U/L (ref 39–117)
CO2: 23 mEq/L (ref 19–32)
Potassium: 4.1 mEq/L (ref 3.5–5.3)
Sodium: 140 mEq/L (ref 135–145)
Total Bilirubin: 0.3 mg/dL (ref 0.3–1.2)
Total Protein: 6.1 g/dL (ref 6.0–8.3)

## 2011-01-29 LAB — CBC WITH DIFFERENTIAL/PLATELET
Eosinophils Absolute: 0.2 10*3/uL (ref 0.0–0.5)
HGB: 11.4 g/dL — ABNORMAL LOW (ref 11.6–15.9)
MCV: 96.3 fL (ref 79.5–101.0)
MONO%: 8 % (ref 0.0–14.0)
NEUT#: 4.2 10*3/uL (ref 1.5–6.5)
RBC: 3.48 10*6/uL — ABNORMAL LOW (ref 3.70–5.45)
RDW: 14.2 % (ref 11.2–14.5)
WBC: 5.6 10*3/uL (ref 3.9–10.3)
lymph#: 0.8 10*3/uL — ABNORMAL LOW (ref 0.9–3.3)
nRBC: 0 % (ref 0–0)

## 2011-01-30 ENCOUNTER — Encounter: Payer: Self-pay | Admitting: *Deleted

## 2011-02-05 ENCOUNTER — Other Ambulatory Visit: Payer: Self-pay | Admitting: Oncology

## 2011-02-05 ENCOUNTER — Encounter (HOSPITAL_BASED_OUTPATIENT_CLINIC_OR_DEPARTMENT_OTHER): Payer: BC Managed Care – PPO | Admitting: Oncology

## 2011-02-05 DIAGNOSIS — C50319 Malignant neoplasm of lower-inner quadrant of unspecified female breast: Secondary | ICD-10-CM

## 2011-02-05 DIAGNOSIS — H10239 Serous conjunctivitis, except viral, unspecified eye: Secondary | ICD-10-CM

## 2011-02-05 DIAGNOSIS — Z452 Encounter for adjustment and management of vascular access device: Secondary | ICD-10-CM

## 2011-02-05 DIAGNOSIS — Z5111 Encounter for antineoplastic chemotherapy: Secondary | ICD-10-CM

## 2011-02-05 DIAGNOSIS — H409 Unspecified glaucoma: Secondary | ICD-10-CM

## 2011-02-05 DIAGNOSIS — Z170 Estrogen receptor positive status [ER+]: Secondary | ICD-10-CM

## 2011-02-05 LAB — CBC WITH DIFFERENTIAL/PLATELET
BASO%: 0.5 % (ref 0.0–2.0)
LYMPH%: 12.7 % — ABNORMAL LOW (ref 14.0–49.7)
MCHC: 34 g/dL (ref 31.5–36.0)
MCV: 96.4 fL (ref 79.5–101.0)
MONO%: 5.8 % (ref 0.0–14.0)
Platelets: 304 10*3/uL (ref 145–400)
RBC: 3.6 10*6/uL — ABNORMAL LOW (ref 3.70–5.45)
RDW: 13.9 % (ref 11.2–14.5)
WBC: 9.2 10*3/uL (ref 3.9–10.3)

## 2011-02-05 LAB — BASIC METABOLIC PANEL
Calcium: 9.3 mg/dL (ref 8.4–10.5)
Sodium: 139 mEq/L (ref 135–145)

## 2011-02-06 ENCOUNTER — Other Ambulatory Visit: Payer: Self-pay | Admitting: Oncology

## 2011-02-06 DIAGNOSIS — C50919 Malignant neoplasm of unspecified site of unspecified female breast: Secondary | ICD-10-CM

## 2011-02-12 ENCOUNTER — Other Ambulatory Visit: Payer: Self-pay | Admitting: Oncology

## 2011-02-12 ENCOUNTER — Encounter (HOSPITAL_BASED_OUTPATIENT_CLINIC_OR_DEPARTMENT_OTHER): Payer: BC Managed Care – PPO | Admitting: Oncology

## 2011-02-12 DIAGNOSIS — Z5111 Encounter for antineoplastic chemotherapy: Secondary | ICD-10-CM

## 2011-02-12 DIAGNOSIS — C50319 Malignant neoplasm of lower-inner quadrant of unspecified female breast: Secondary | ICD-10-CM

## 2011-02-12 DIAGNOSIS — Z170 Estrogen receptor positive status [ER+]: Secondary | ICD-10-CM

## 2011-02-12 DIAGNOSIS — H409 Unspecified glaucoma: Secondary | ICD-10-CM

## 2011-02-12 DIAGNOSIS — H10239 Serous conjunctivitis, except viral, unspecified eye: Secondary | ICD-10-CM

## 2011-02-12 LAB — CBC WITH DIFFERENTIAL/PLATELET
BASO%: 0.6 % (ref 0.0–2.0)
EOS%: 0.9 % (ref 0.0–7.0)
MCH: 32.8 pg (ref 25.1–34.0)
MCHC: 34.6 g/dL (ref 31.5–36.0)
MCV: 94.8 fL (ref 79.5–101.0)
MONO%: 6.5 % (ref 0.0–14.0)
RBC: 3.84 10*6/uL (ref 3.70–5.45)
RDW: 14 % (ref 11.2–14.5)
lymph#: 1.2 10*3/uL (ref 0.9–3.3)

## 2011-02-12 LAB — BASIC METABOLIC PANEL
BUN: 8 mg/dL (ref 6–23)
Potassium: 3.7 mEq/L (ref 3.5–5.3)
Sodium: 140 mEq/L (ref 135–145)

## 2011-02-14 ENCOUNTER — Ambulatory Visit
Admission: RE | Admit: 2011-02-14 | Discharge: 2011-02-14 | Disposition: A | Discharge status: Home / Self Care | Payer: BC Managed Care – PPO | Source: Ambulatory Visit | Attending: Radiation Oncology | Admitting: Radiation Oncology

## 2011-02-14 DIAGNOSIS — Z510 Encounter for antineoplastic radiation therapy: Secondary | ICD-10-CM | POA: Insufficient documentation

## 2011-02-14 DIAGNOSIS — C50319 Malignant neoplasm of lower-inner quadrant of unspecified female breast: Secondary | ICD-10-CM | POA: Insufficient documentation

## 2011-02-14 DIAGNOSIS — G589 Mononeuropathy, unspecified: Secondary | ICD-10-CM | POA: Insufficient documentation

## 2011-02-16 ENCOUNTER — Encounter (INDEPENDENT_AMBULATORY_CARE_PROVIDER_SITE_OTHER): Payer: Self-pay | Admitting: Surgery

## 2011-02-19 ENCOUNTER — Encounter (HOSPITAL_BASED_OUTPATIENT_CLINIC_OR_DEPARTMENT_OTHER): Payer: BC Managed Care – PPO | Admitting: Oncology

## 2011-02-19 ENCOUNTER — Other Ambulatory Visit: Payer: Self-pay | Admitting: Oncology

## 2011-02-19 DIAGNOSIS — H409 Unspecified glaucoma: Secondary | ICD-10-CM

## 2011-02-19 DIAGNOSIS — H10239 Serous conjunctivitis, except viral, unspecified eye: Secondary | ICD-10-CM

## 2011-02-19 DIAGNOSIS — C50319 Malignant neoplasm of lower-inner quadrant of unspecified female breast: Secondary | ICD-10-CM

## 2011-02-19 DIAGNOSIS — Z170 Estrogen receptor positive status [ER+]: Secondary | ICD-10-CM

## 2011-02-19 LAB — CBC WITH DIFFERENTIAL/PLATELET
Basophils Absolute: 0 10*3/uL (ref 0.0–0.1)
Eosinophils Absolute: 0.1 10*3/uL (ref 0.0–0.5)
HGB: 12.2 g/dL (ref 11.6–15.9)
LYMPH%: 10.4 % — ABNORMAL LOW (ref 14.0–49.7)
MONO#: 0.8 10*3/uL (ref 0.1–0.9)
NEUT#: 6.7 10*3/uL — ABNORMAL HIGH (ref 1.5–6.5)
Platelets: 294 10*3/uL (ref 145–400)
RBC: 3.74 10*6/uL (ref 3.70–5.45)
WBC: 8.5 10*3/uL (ref 3.9–10.3)
nRBC: 0 % (ref 0–0)

## 2011-02-23 ENCOUNTER — Encounter: Payer: Self-pay | Admitting: Oncology

## 2011-02-23 DIAGNOSIS — C50919 Malignant neoplasm of unspecified site of unspecified female breast: Secondary | ICD-10-CM

## 2011-02-23 HISTORY — DX: Malignant neoplasm of unspecified site of unspecified female breast: C50.919

## 2011-02-26 ENCOUNTER — Other Ambulatory Visit: Payer: Self-pay | Admitting: Oncology

## 2011-02-26 ENCOUNTER — Other Ambulatory Visit (HOSPITAL_BASED_OUTPATIENT_CLINIC_OR_DEPARTMENT_OTHER): Payer: BC Managed Care – PPO | Admitting: Lab

## 2011-02-26 ENCOUNTER — Ambulatory Visit: Payer: BC Managed Care – PPO

## 2011-02-26 ENCOUNTER — Ambulatory Visit (HOSPITAL_BASED_OUTPATIENT_CLINIC_OR_DEPARTMENT_OTHER): Payer: BC Managed Care – PPO | Admitting: Oncology

## 2011-02-26 VITALS — BP 115/83 | HR 93 | Temp 98.1°F | Ht 66.5 in | Wt 201.8 lb

## 2011-02-26 DIAGNOSIS — G609 Hereditary and idiopathic neuropathy, unspecified: Secondary | ICD-10-CM

## 2011-02-26 DIAGNOSIS — C50319 Malignant neoplasm of lower-inner quadrant of unspecified female breast: Secondary | ICD-10-CM

## 2011-02-26 DIAGNOSIS — C50919 Malignant neoplasm of unspecified site of unspecified female breast: Secondary | ICD-10-CM

## 2011-02-26 DIAGNOSIS — Z170 Estrogen receptor positive status [ER+]: Secondary | ICD-10-CM

## 2011-02-26 DIAGNOSIS — E559 Vitamin D deficiency, unspecified: Secondary | ICD-10-CM

## 2011-02-26 LAB — CBC WITH DIFFERENTIAL/PLATELET
Basophils Absolute: 0 10*3/uL (ref 0.0–0.1)
HCT: 37.2 % (ref 34.8–46.6)
HGB: 12.8 g/dL (ref 11.6–15.9)
MCH: 32.1 pg (ref 25.1–34.0)
MONO#: 1 10*3/uL — ABNORMAL HIGH (ref 0.1–0.9)
NEUT%: 77.5 % — ABNORMAL HIGH (ref 38.4–76.8)
WBC: 10.6 10*3/uL — ABNORMAL HIGH (ref 3.9–10.3)
lymph#: 1.2 10*3/uL (ref 0.9–3.3)

## 2011-02-26 MED ORDER — GABAPENTIN 100 MG PO CAPS: ORAL_CAPSULE | ORAL | Status: DC

## 2011-02-26 NOTE — Progress Notes (Signed)
Note dictated

## 2011-02-27 ENCOUNTER — Encounter: Payer: Self-pay | Admitting: Certified Registered Nurse Anesthetist

## 2011-02-27 ENCOUNTER — Telehealth: Payer: Self-pay | Admitting: *Deleted

## 2011-02-27 NOTE — Telephone Encounter (Signed)
Rec'd call from pt stating that she is supposed to follow up with Dr Luisa Hart. Pt is inquiring if she is able to have PAC removed and also have tx for folliculitis. Pt states that Dr Welton Flakes has tx pt with Keflex in the past for this condition. It has been mentioned by Dr Welton Flakes that Dr Luisa Hart would surgically remove infected areas during the same [facility] removal.

## 2011-02-27 NOTE — Progress Notes (Signed)
CC:   Heather Schaefer, M.D. Heather Schaefer, M.D. Heather Salvage, MD  DIAGNOSIS:  A 45 year old female with stage I (T1a N0 M0), estrogen receptor positive, progesterone receptor and HER-2/neu negative infiltrating ductal carcinoma of the left breast with a proliferation marker Ki-67 of 97%, status post left lumpectomy with sentinel node biopsy with the final pathology revealing a 1.1-cm high-grade invasive ductal carcinoma with associated high-grade ductal carcinoma in situ with 4 sentinel nodes that were negative for metastatic disease.  PAST THERAPY: 1. Status post 4 cycles of dose-dense AC. 2. Now status post 9 weeks of single-agent Taxol.  INTERVAL HISTORY:  The patient is seen in followup today.  Overall she seems to be doing okay, however, her peripheral neuropathy still remains quite active in spite of being off of Taxol for about 2 weeks now.  She denies any fevers, chills, night sweats, headaches, shortness of breath, chest pains, palpitations.  She has no nausea or vomiting, no fevers or chills, and remainder of the 10-point review of systems is negative.  ALLERGIES:  Hydrocodone.  CURRENT MEDICATIONS:  Current medications are in the EMR.  PERFORMANCE STATUS:  ECOG performance status is 1.  PHYSICAL EXAMINATION:  General:  The patient is awake, alert, in no acute distress.  She appears well, accompanied by her husband.  Vital Signs:  Stable and recorded in the EMR.  HEENT Exam:  EOMI.  PERRLA. Sclerae are anicteric.  No conjunctival pallor.  Oral mucosa is moist. Neck:  Supple.  Lungs:  Clear.  Cardiovascular : Regular rate and rhythm.  No murmurs, gallops, or rubs.  Abdomen:  Soft, nontender, nondistended.  Bowel sounds are present.  No HSM.  Extremities:  No edema.  Neuro:  Patient is alert, oriented, otherwise nonfocal.  LABORATORY DATA:  WBC 10.6, hemoglobin 12.8, hematocrit 37.2, platelets are 310,000.  IMPRESSION AND PLAN:  A 45 year old female with stage I  invasive ductal carcinoma that is estrogen receptor positive, progesterone receptor negative, HER-2/neu negative with associated high-grade ductal carcinoma in situ.  She is status post lumpectomy with sentinel node biopsy.  She has received 4 cycles of dose-dense Adriamycin and Cytoxan, followed by 9 weeks of single-agent Taxol.  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  She and I discussed the risks and benefits of continuing another 3 weeks of Taxol and I do think that it would be detrimental to her overall quality of life since she is developing significant neuropathies.  At this time, recommendation therefore is to discontinue the Taxol.  She will go see Dr. Lurline Schaefer for consultation regarding radiation to the breast. Once she completes radiation, then my plan would be to get her on tamoxifen on a daily basis for the next 5 years.  All questions were answered today and I spent about 40 minutes with the patient.  Greater than 50% of the time was spent face-to-face counseling and coordination of care.    ______________________________ Drue Second, M.D. KK/MEDQ  D:  02/26/2011  T:  02/27/2011  Job:  [id number]

## 2011-02-28 ENCOUNTER — Other Ambulatory Visit: Payer: Self-pay | Admitting: Oncology

## 2011-02-28 NOTE — Telephone Encounter (Signed)
Please tell patient that the [facility] will be removed after her radiation is completed

## 2011-03-01 ENCOUNTER — Telehealth: Payer: Self-pay | Admitting: *Deleted

## 2011-03-01 NOTE — Telephone Encounter (Signed)
Called pt - Per Dr. Welton Flakes pt to wait until she has completed Radiation treatment. Pt verbalized understanding.

## 2011-03-05 ENCOUNTER — Other Ambulatory Visit: Payer: BC Managed Care – PPO

## 2011-03-05 ENCOUNTER — Ambulatory Visit: Payer: BC Managed Care – PPO

## 2011-03-05 ENCOUNTER — Ambulatory Visit: Payer: BC Managed Care – PPO | Admitting: Physician Assistant

## 2011-03-06 ENCOUNTER — Ambulatory Visit: Payer: BC Managed Care – PPO

## 2011-03-06 ENCOUNTER — Other Ambulatory Visit: Payer: Self-pay

## 2011-03-07 ENCOUNTER — Ambulatory Visit
Admission: RE | Admit: 2011-03-07 | Discharge: 2011-03-07 | Disposition: A | Discharge status: Home / Self Care | Payer: BC Managed Care – PPO | Source: Ambulatory Visit | Attending: Radiation Oncology | Admitting: Radiation Oncology

## 2011-03-07 ENCOUNTER — Telehealth: Payer: Self-pay | Admitting: Radiation Oncology

## 2011-03-07 DIAGNOSIS — C50319 Malignant neoplasm of lower-inner quadrant of unspecified female breast: Secondary | ICD-10-CM

## 2011-03-07 NOTE — Progress Notes (Signed)
[facility] Radiation Oncology Simulation and Treatment Planning Note   Name: Heather Schaefer MRN: [id number]  Date: 03/07/2011  DOB: 12/11/1965  Status:outpatient    DIAGNOSIS: The encounter diagnosis was Malignant neoplasm of lower-inner quadrant of female breast. Breast cancer   Primary site: Breast (Left)   Staging method: AJCC 7th Edition   Clinical: Stage IA (T1, N0, cM0) signed by Victorino December, MD on 02/06/2011  7:07 PM   Pathologic: Stage IA (T1, N0, cM0) signed by Victorino December, MD on 02/06/2011  7:08 PM   Summary: Stage IA (T1, N0, cM0)   Prognostic indicators: ER 56%, PR 0%, Ki-67 97%, Her2Neu negative    SIDE: left   CONSENT VERIFIED:yes   SET UP: Patient is setup supine   IMMOBILIZATION: Following immobilization is used:Custom Moldable Pillow   NARRATIVE:The patient was brought to the CT Simulation planning suite.  Identity was confirmed.  All relevant records and images related to the planned course of therapy were reviewed.  Then, the patient was positioned in a stable reproducible clinical set-up for radiation therapy.  CT images were obtained.  Skin markings were placed.  The CT images were loaded into the planning software where the target and avoidance structures were contoured.  The radiation prescription was entered and confirmed.   TREATMENT PLANNING NOTE:  Treatment planning then occurred. I have requested : MLC's, an isodose plan, dose calculation.

## 2011-03-07 NOTE — Telephone Encounter (Signed)
A user error has taken place: encounter opened in error, closed for administrative reasons.

## 2011-03-07 NOTE — Telephone Encounter (Signed)
Met with patient to discuss RO billing. Patient mentioned concern of finances due to treatment going into 2013.  Gave patient MCD application and epp.  She will return at next visit.

## 2011-03-12 ENCOUNTER — Other Ambulatory Visit: Payer: Self-pay | Admitting: Oncology

## 2011-03-12 ENCOUNTER — Telehealth: Payer: Self-pay | Admitting: *Deleted

## 2011-03-12 DIAGNOSIS — C50919 Malignant neoplasm of unspecified site of unspecified female breast: Secondary | ICD-10-CM

## 2011-03-12 MED ORDER — HEPARIN SOD (PORK) LOCK FLUSH 100 UNIT/ML IV SOLN
500.0000 [IU] | Freq: Once | INTRAVENOUS | Status: DC
  Filled 2011-03-12: qty 5

## 2011-03-12 MED ORDER — SODIUM CHLORIDE 0.9 % IJ SOLN
10.0000 mL | INTRAMUSCULAR | Status: DC | PRN
  Filled 2011-03-12: qty 10

## 2011-03-12 NOTE — Telephone Encounter (Signed)
Gave patient appointment for flush every 8 weeks starting on 04-03-2011

## 2011-03-19 ENCOUNTER — Ambulatory Visit
Admission: RE | Admit: 2011-03-19 | Discharge: 2011-03-19 | Disposition: A | Discharge status: Home / Self Care | Payer: BC Managed Care – PPO | Source: Ambulatory Visit | Attending: Radiation Oncology | Admitting: Radiation Oncology

## 2011-03-20 ENCOUNTER — Ambulatory Visit
Admission: RE | Admit: 2011-03-20 | Discharge: 2011-03-20 | Disposition: A | Discharge status: Home / Self Care | Payer: BC Managed Care – PPO | Source: Ambulatory Visit | Attending: Radiation Oncology | Admitting: Radiation Oncology

## 2011-03-20 ENCOUNTER — Encounter: Payer: Self-pay | Admitting: Radiation Oncology

## 2011-03-20 VITALS — BP 123/78 | HR 105 | Resp 18 | Wt 207.2 lb

## 2011-03-20 DIAGNOSIS — C50919 Malignant neoplasm of unspecified site of unspecified female breast: Secondary | ICD-10-CM

## 2011-03-20 DIAGNOSIS — C50319 Malignant neoplasm of lower-inner quadrant of unspecified female breast: Secondary | ICD-10-CM

## 2011-03-20 NOTE — Progress Notes (Signed)
[facility] Radiation Oncology Simulation Verification Note   Name: Heather Schaefer MRN: [id number]  Date: 03/19/2011  DOB: October 07, 1965  Status:outpatient    DIAGNOSIS: The encounter diagnosis was Breast cancer. Breast cancer   Primary site: Breast (Left)   Staging method: AJCC 7th Edition   Clinical: Stage IA (T1, N0, cM0) signed by Victorino December, MD on 02/06/2011  7:07 PM   Pathologic: Stage IA (T1, N0, cM0) signed by Victorino December, MD on 02/06/2011  7:08 PM   Summary: Stage IA (T1, N0, cM0)   Prognostic indicators: ER 56%, PR 0%, Ki-67 97%, Her2Neu negative   POSITION: Patient is positioned supine and  Isocenter and mlcs were reviewed.. Treatment was approved.  NARRATIVE: She tolerated simulation well.

## 2011-03-20 NOTE — Progress Notes (Signed)
Patient presents to the clinic today accompanied by her husband requesting to be seen by Dr. Michell Heinrich prior to treatment. Patient reports that she has a few questions for Dr. Michell Heinrich but, denies any being pressing. No distress noted. Steady gait noted. Pleasant affect noted. Patient reports she is "sore all over" but relates that to the affect of her age. Patient denies any pain at this time. Patient is concerned about the persistent dry cough and nasal drainage that she has had for two weeks. Patient fears that she will have "a coughing spell and mess up her treatment because she knows she needs to lay really still."

## 2011-03-20 NOTE — Progress Notes (Signed)
Weekly Management Note Current Dose:  1.8 Gy  Projected Dose:  61   Narrative:  The patient presents for routine under treatment assessment.  CBCT/MVCT images/Port film x-rays were reviewed.  The chart was checked. Doing well. Met a nurse who encouraged her to wear a thyroid shield and was also concerned about heart in field while coughing. Otherwise feeling well.  Physical Findings: Alert, awake. No distress.  Impression:  The patient is tolerating radiation.  Plan:  Continue treatment as planned.

## 2011-03-20 NOTE — Progress Notes (Signed)
Weekly Management Note Current Dose:  1.8 Gy  Projected Dose:  61   Narrative:  The patient presents for routine under treatment assessment.  CBCT/MVCT images/Port film x-rays were reviewed.  The chart was checked. Doing well. Met a nurse who encouraged her to wear a thyroid shield and was also concerned about heart in field while coughing. Otherwise feeling well.  Physical Findings: Alert, awake. No distress.  Impression:  The patient is tolerating radiation.  Plan:  Continue treatment as planned. Discussed distance of thyroid from tx field. Showed coronal treatment plan images.

## 2011-03-21 ENCOUNTER — Ambulatory Visit
Admission: RE | Admit: 2011-03-21 | Discharge: 2011-03-21 | Disposition: A | Discharge status: Home / Self Care | Payer: BC Managed Care – PPO | Source: Ambulatory Visit | Attending: Radiation Oncology | Admitting: Radiation Oncology

## 2011-03-22 ENCOUNTER — Ambulatory Visit
Admission: RE | Admit: 2011-03-22 | Discharge: 2011-03-22 | Disposition: A | Discharge status: Home / Self Care | Payer: BC Managed Care – PPO | Source: Ambulatory Visit | Attending: Radiation Oncology | Admitting: Radiation Oncology

## 2011-03-22 ENCOUNTER — Ambulatory Visit: Payer: BC Managed Care – PPO

## 2011-03-22 DIAGNOSIS — C50319 Malignant neoplasm of lower-inner quadrant of unspecified female breast: Secondary | ICD-10-CM

## 2011-03-22 MED ORDER — ALRA NON-METALLIC DEODORANT (RAD-ONC)
1.0000 "application " | Freq: Once | TOPICAL | Status: AC
  Administered 2011-03-22: 1 via TOPICAL

## 2011-03-22 MED ORDER — RADIAPLEXRX EX GEL
Freq: Once | CUTANEOUS | Status: AC
  Administered 2011-03-22: 11:00:00 via TOPICAL

## 2011-03-22 NOTE — Progress Notes (Signed)
POST SIM TEACHING, RADIATION THERAPY AND YOU BOOKLET ,FLYER ON SKIN PRODUCTS, ALRA AND RADIAPLEX GEL GIVEN TO PT, DISCUSSED S/E, S/S TO REPORT, SCHEDYLE CALENDER GIVEN TO PT ALSO, CAN CALL FOR ANY QUESTINS/CONCERNS, VERBAL UNDERSTANDING BY PT

## 2011-03-23 ENCOUNTER — Ambulatory Visit
Admission: RE | Admit: 2011-03-23 | Discharge: 2011-03-23 | Disposition: A | Discharge status: Home / Self Care | Payer: BC Managed Care – PPO | Source: Ambulatory Visit | Attending: Radiation Oncology | Admitting: Radiation Oncology

## 2011-03-23 ENCOUNTER — Other Ambulatory Visit: Payer: Self-pay | Admitting: Physician Assistant

## 2011-03-23 ENCOUNTER — Ambulatory Visit: Payer: BC Managed Care – PPO

## 2011-03-23 DIAGNOSIS — C50919 Malignant neoplasm of unspecified site of unspecified female breast: Secondary | ICD-10-CM

## 2011-03-26 ENCOUNTER — Ambulatory Visit
Admission: RE | Admit: 2011-03-26 | Discharge: 2011-03-26 | Disposition: A | Discharge status: Home / Self Care | Payer: BC Managed Care – PPO | Source: Ambulatory Visit | Attending: Radiation Oncology | Admitting: Radiation Oncology

## 2011-03-27 ENCOUNTER — Other Ambulatory Visit: Payer: Self-pay | Admitting: Physician Assistant

## 2011-03-27 ENCOUNTER — Ambulatory Visit
Admission: RE | Admit: 2011-03-27 | Discharge: 2011-03-27 | Disposition: A | Discharge status: Home / Self Care | Payer: BC Managed Care – PPO | Source: Ambulatory Visit | Attending: Radiation Oncology | Admitting: Radiation Oncology

## 2011-03-27 DIAGNOSIS — C50919 Malignant neoplasm of unspecified site of unspecified female breast: Secondary | ICD-10-CM

## 2011-03-27 DIAGNOSIS — C50319 Malignant neoplasm of lower-inner quadrant of unspecified female breast: Secondary | ICD-10-CM

## 2011-03-27 NOTE — Progress Notes (Signed)
Has completed 6/25 radiation treatments to left breast. No skin changes .occassional  shooting pain thru breast Other medications added to list; vit. D and e as well as folic acid and b-complex.

## 2011-03-27 NOTE — Progress Notes (Signed)
Weekly Management Note Current Dose: 10.8  Gy  Projected Dose: 61 Gy   Narrative:  The patient presents for routine under treatment assessment.  CBCT/MVCT images/Port film x-rays were reviewed.  The chart was checked. No complaints. Would like to take Christmas Eve off to visit their daughter.  Physical Findings: Weight: 201 lb 11.2 oz (91.491 kg). Unchanged  Impression:  The patient is tolerating radiation.  Plan:  Continue treatment as planned. Continue radiaplex. OK to take Christmas Eve off.

## 2011-03-28 ENCOUNTER — Ambulatory Visit
Admission: RE | Admit: 2011-03-28 | Discharge: 2011-03-28 | Disposition: A | Discharge status: Home / Self Care | Payer: BC Managed Care – PPO | Source: Ambulatory Visit | Attending: Radiation Oncology | Admitting: Radiation Oncology

## 2011-03-29 ENCOUNTER — Ambulatory Visit
Admission: RE | Admit: 2011-03-29 | Discharge: 2011-03-29 | Disposition: A | Discharge status: Home / Self Care | Payer: BC Managed Care – PPO | Source: Ambulatory Visit | Attending: Radiation Oncology | Admitting: Radiation Oncology

## 2011-03-30 ENCOUNTER — Ambulatory Visit
Admission: RE | Admit: 2011-03-30 | Discharge: 2011-03-30 | Disposition: A | Discharge status: Home / Self Care | Payer: BC Managed Care – PPO | Source: Ambulatory Visit | Attending: Radiation Oncology | Admitting: Radiation Oncology

## 2011-04-02 ENCOUNTER — Other Ambulatory Visit: Payer: Self-pay | Admitting: Certified Registered Nurse Anesthetist

## 2011-04-02 ENCOUNTER — Other Ambulatory Visit: Payer: Self-pay | Admitting: Physician Assistant

## 2011-04-02 ENCOUNTER — Ambulatory Visit
Admission: RE | Admit: 2011-04-02 | Discharge: 2011-04-02 | Disposition: A | Discharge status: Home / Self Care | Payer: BC Managed Care – PPO | Source: Ambulatory Visit | Attending: Radiation Oncology | Admitting: Radiation Oncology

## 2011-04-02 ENCOUNTER — Ambulatory Visit (HOSPITAL_BASED_OUTPATIENT_CLINIC_OR_DEPARTMENT_OTHER): Payer: BC Managed Care – PPO

## 2011-04-02 DIAGNOSIS — C50319 Malignant neoplasm of lower-inner quadrant of unspecified female breast: Secondary | ICD-10-CM

## 2011-04-02 DIAGNOSIS — Z452 Encounter for adjustment and management of vascular access device: Secondary | ICD-10-CM

## 2011-04-02 DIAGNOSIS — C50919 Malignant neoplasm of unspecified site of unspecified female breast: Secondary | ICD-10-CM

## 2011-04-02 MED ORDER — SODIUM CHLORIDE 0.9 % IJ SOLN
10.0000 mL | INTRAMUSCULAR | Status: DC | PRN
  Administered 2011-04-02: 10 mL via INTRAVENOUS
  Filled 2011-04-02: qty 10

## 2011-04-02 MED ORDER — LORAZEPAM 0.5 MG PO TABS: 0.5000 mg | ORAL_TABLET | Freq: Four times a day (QID) | ORAL | Status: DC | PRN

## 2011-04-02 MED ORDER — ALTEPLASE 2 MG IJ SOLR
2.0000 mg | Freq: Once | INTRAMUSCULAR | Status: AC | PRN
  Administered 2011-04-02: 2 mg
  Filled 2011-04-02: qty 2

## 2011-04-02 MED ORDER — HEPARIN SOD (PORK) LOCK FLUSH 100 UNIT/ML IV SOLN
500.0000 [IU] | Freq: Once | INTRAVENOUS | Status: AC
  Administered 2011-04-02: 500 [IU] via INTRAVENOUS
  Filled 2011-04-02: qty 5

## 2011-04-02 NOTE — Telephone Encounter (Signed)
patient was thinking her appointment for flush was today so she has placed her cream on

## 2011-04-02 NOTE — Telephone Encounter (Signed)
Message left for pt. To pick up Ativan prescription at [place], [place].  Script called to [employer] at [place] today.

## 2011-04-03 ENCOUNTER — Ambulatory Visit
Admission: RE | Admit: 2011-04-03 | Discharge: 2011-04-03 | Disposition: A | Discharge status: Home / Self Care | Payer: BC Managed Care – PPO | Source: Ambulatory Visit | Attending: Radiation Oncology | Admitting: Radiation Oncology

## 2011-04-03 DIAGNOSIS — C50319 Malignant neoplasm of lower-inner quadrant of unspecified female breast: Secondary | ICD-10-CM

## 2011-04-03 NOTE — Progress Notes (Signed)
Weekly Management Note Current Dose: 19.8  Gy  Projected Dose: 61 Gy   Narrative:  The patient presents for routine under treatment assessment.  CBCT/MVCT images/Port film x-rays were reviewed.  The chart was checked. Doing well. No complaints. Taking neurontin for myalgias.  Physical Findings: Skin is slightly dark.  Impression:  The patient is tolerating radiation.  Plan:  Continue treatment as planned. Continue radiaplex.

## 2011-04-03 NOTE — Progress Notes (Signed)
11/25 left breast  Treatments completed. Mild  Skin changes. Denies fatigue. Using gabapentin for neuropathy.

## 2011-04-04 ENCOUNTER — Ambulatory Visit
Admission: RE | Admit: 2011-04-04 | Discharge: 2011-04-04 | Disposition: A | Discharge status: Home / Self Care | Payer: BC Managed Care – PPO | Source: Ambulatory Visit | Attending: Radiation Oncology | Admitting: Radiation Oncology

## 2011-04-05 ENCOUNTER — Ambulatory Visit
Admission: RE | Admit: 2011-04-05 | Discharge: 2011-04-05 | Disposition: A | Discharge status: Home / Self Care | Payer: BC Managed Care – PPO | Source: Ambulatory Visit | Attending: Radiation Oncology | Admitting: Radiation Oncology

## 2011-04-06 ENCOUNTER — Ambulatory Visit
Admission: RE | Admit: 2011-04-06 | Discharge: 2011-04-06 | Disposition: A | Discharge status: Home / Self Care | Payer: BC Managed Care – PPO | Source: Ambulatory Visit | Attending: Radiation Oncology | Admitting: Radiation Oncology

## 2011-04-09 ENCOUNTER — Ambulatory Visit
Admission: RE | Admit: 2011-04-09 | Discharge: 2011-04-09 | Disposition: A | Discharge status: Home / Self Care | Payer: BC Managed Care – PPO | Source: Ambulatory Visit | Attending: Radiation Oncology | Admitting: Radiation Oncology

## 2011-04-10 ENCOUNTER — Ambulatory Visit
Admission: RE | Admit: 2011-04-10 | Discharge: 2011-04-10 | Disposition: A | Discharge status: Home / Self Care | Payer: BC Managed Care – PPO | Source: Ambulatory Visit | Attending: Radiation Oncology | Admitting: Radiation Oncology

## 2011-04-10 VITALS — Wt 200.1 lb

## 2011-04-10 DIAGNOSIS — C50319 Malignant neoplasm of lower-inner quadrant of unspecified female breast: Secondary | ICD-10-CM

## 2011-04-10 NOTE — Progress Notes (Signed)
C/O SOME TENDERNESS AND SORENESS AT NIPPLE AND LYMPHNODE SITE

## 2011-04-11 ENCOUNTER — Ambulatory Visit: Payer: BC Managed Care – PPO

## 2011-04-11 ENCOUNTER — Ambulatory Visit
Admission: RE | Admit: 2011-04-11 | Discharge: 2011-04-11 | Disposition: A | Discharge status: Home / Self Care | Payer: BC Managed Care – PPO | Source: Ambulatory Visit | Attending: Radiation Oncology | Admitting: Radiation Oncology

## 2011-04-11 NOTE — Progress Notes (Signed)
DIAGNOSIS:  Left breast cancer.  NARRATIVE:  Mrs. Doubleday is seen today for weekly assessment.  She has completed 2880 cGy of a planned 6100 cGy directed at the left breast area.  The patient continues to tolerate her treatments well.  She has minimal sensitivity and pruritus within the breast region.  PHYSICAL EXAMINATION:  THE Patient's weight is 200 pounds, which is stable.  Respiratory: Examination of the lungs reveals them to be clear. Breasts: Examination of the left breast reveals some mild erythema and hyperpigmentation changes, but no skin breakdown is appreciated.  IMPRESSION AND PLAN:  The patient is tolerating her radiation treatments well thus far.  The patient's radiation fields are setting up accurately.  The patient's radiation chart was checked today.  Plan is to continue with breast conservation therapy to a cumulative dose of 6100 cGy.    ______________________________ Billie Lade, Ph.D., M.D. JDK/MEDQ  D:  04/10/2011  T:  04/11/2011  Job:  [id number]

## 2011-04-12 ENCOUNTER — Ambulatory Visit (INDEPENDENT_AMBULATORY_CARE_PROVIDER_SITE_OTHER): Payer: Self-pay | Admitting: Surgery

## 2011-04-12 ENCOUNTER — Ambulatory Visit
Admission: RE | Admit: 2011-04-12 | Discharge: 2011-04-12 | Disposition: A | Discharge status: Home / Self Care | Payer: BC Managed Care – PPO | Source: Ambulatory Visit | Attending: Radiation Oncology | Admitting: Radiation Oncology

## 2011-04-13 ENCOUNTER — Telehealth: Payer: Self-pay | Admitting: Oncology

## 2011-04-13 ENCOUNTER — Other Ambulatory Visit: Payer: Self-pay | Admitting: Oncology

## 2011-04-13 ENCOUNTER — Other Ambulatory Visit (HOSPITAL_BASED_OUTPATIENT_CLINIC_OR_DEPARTMENT_OTHER): Payer: BC Managed Care – PPO | Admitting: Lab

## 2011-04-13 ENCOUNTER — Ambulatory Visit (HOSPITAL_BASED_OUTPATIENT_CLINIC_OR_DEPARTMENT_OTHER): Payer: BC Managed Care – PPO | Admitting: Oncology

## 2011-04-13 ENCOUNTER — Ambulatory Visit
Admission: RE | Admit: 2011-04-13 | Discharge: 2011-04-13 | Disposition: A | Discharge status: Home / Self Care | Payer: BC Managed Care – PPO | Source: Ambulatory Visit | Attending: Radiation Oncology | Admitting: Radiation Oncology

## 2011-04-13 VITALS — BP 109/76 | HR 79 | Temp 97.9°F | Ht 66.5 in | Wt 200.7 lb

## 2011-04-13 DIAGNOSIS — E559 Vitamin D deficiency, unspecified: Secondary | ICD-10-CM

## 2011-04-13 DIAGNOSIS — Z79899 Other long term (current) drug therapy: Secondary | ICD-10-CM

## 2011-04-13 DIAGNOSIS — C50919 Malignant neoplasm of unspecified site of unspecified female breast: Secondary | ICD-10-CM

## 2011-04-13 DIAGNOSIS — Z170 Estrogen receptor positive status [ER+]: Secondary | ICD-10-CM

## 2011-04-13 LAB — CBC WITH DIFFERENTIAL/PLATELET
Basophils Absolute: 0 10*3/uL (ref 0.0–0.1)
Eosinophils Absolute: 0.2 10*3/uL (ref 0.0–0.5)
HGB: 13.4 g/dL (ref 11.6–15.9)
LYMPH%: 22.5 % (ref 14.0–49.7)
MCV: 89.8 fL (ref 79.5–101.0)
MONO%: 9.4 % (ref 0.0–14.0)
NEUT#: 2.7 10*3/uL (ref 1.5–6.5)
Platelets: 274 10*3/uL (ref 145–400)

## 2011-04-13 NOTE — Telephone Encounter (Signed)
Gv pt appt for jan2013 °

## 2011-04-14 LAB — COMPREHENSIVE METABOLIC PANEL
Albumin: 4 g/dL (ref 3.5–5.2)
Alkaline Phosphatase: 67 U/L (ref 39–117)
BUN: 8 mg/dL (ref 6–23)
Glucose, Bld: 95 mg/dL (ref 70–99)
Total Bilirubin: 0.6 mg/dL (ref 0.3–1.2)

## 2011-04-16 ENCOUNTER — Ambulatory Visit
Admission: RE | Admit: 2011-04-16 | Discharge: 2011-04-16 | Disposition: A | Discharge status: Home / Self Care | Payer: BC Managed Care – PPO | Source: Ambulatory Visit | Attending: Radiation Oncology | Admitting: Radiation Oncology

## 2011-04-18 ENCOUNTER — Ambulatory Visit
Admission: RE | Admit: 2011-04-18 | Discharge: 2011-04-18 | Disposition: A | Discharge status: Home / Self Care | Payer: BC Managed Care – PPO | Source: Ambulatory Visit | Attending: Radiation Oncology | Admitting: Radiation Oncology

## 2011-04-18 ENCOUNTER — Encounter: Payer: Self-pay | Admitting: Radiation Oncology

## 2011-04-18 DIAGNOSIS — C50319 Malignant neoplasm of lower-inner quadrant of unspecified female breast: Secondary | ICD-10-CM

## 2011-04-18 NOTE — Progress Notes (Signed)
OFFICE PROGRESS NOTE  CC  [name],[name], MD, MD [address] [place] [address]  DIAGNOSIS: 45 year old female with stage I (TI A. 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  PRIOR THERAPY:  #1 patient underwent a left breast lumpectomy with sentinel node biopsy with the final pathology revealing a high-grade invasive ductal carcinoma measuring 1.1 cm with associated high-grade ductal carcinoma in situ with 4 sentinel nodes were negative for metastatic disease. The tumor was ER positive PR negative HER-2/neu negative with a proliferation marker 97%.  #2 status post 4 cycles of dose dense Adriamycin Cytoxan  #3 status post 9 weeks of single agent Taxol. Her last treatment was given on 02/26/2011. Total of 12 weeks of Taxol were planned but do to significant grade 3 peripheral neuropathy the treatment was stopped earlier  #4 patient is receiving radiation therapy by Dr. Lurline Hare.  CURRENT THERAPY:radiation therapy to the left  INTERVAL HISTORY: Heather Schaefer 45 y.o. female returns for followup visit today. Overall she is doing well. She is in the midst of radiation and tolerating it quite well. She does have some skin changes including redness but no desquamation. She continues to have some tenderness to the breast. She denies any fevers chills night sweats headaches shortness of breath chest pains palpitations. She has no myalgias or arthralgias.remainder of the 10 point review of systems is negative.  MEDICAL HISTORY: Past Medical History  Diagnosis Date  . Breast cancer   . Glaucoma   . Breast cancer, female 02/23/2011    ALLERGIES:  is allergic to hydrocodone.  MEDICATIONS:  Current Outpatient Prescriptions  Medication Sig Dispense Refill  . b complex vitamins capsule Take 1 capsule by mouth daily.        . cephALEXin (KEFLEX) 500 MG capsule       . cholecalciferol (VITAMIN D) 1000  UNITS tablet Take 1,000 Units by mouth daily.        Marland Kitchen dextromethorphan-guaiFENesin (MUCINEX DM) 30-600 MG per 12 hr tablet Take 1 tablet by mouth every 12 (twelve) hours.        Marland Kitchen doxycycline (VIBRAMYCIN) 100 MG capsule       . fluconazole (DIFLUCAN) 100 MG tablet       . folic acid (FOLVITE) 1 MG tablet Take 1 mg by mouth daily.        Marland Kitchen gabapentin (NEURONTIN) 100 MG capsule Take 100 mg po daily at bedtime or as directed  60 capsule  2  . ibuprofen (ADVIL,MOTRIN) 200 MG tablet Take 200 mg by mouth every 6 (six) hours as needed.        . lidocaine-prilocaine (EMLA) cream Apply topically as needed.        . loratadine (CLARITIN) 10 MG tablet Take 10 mg by mouth daily.        Marland Kitchen LORazepam (ATIVAN) 0.5 MG tablet TAKE ONE TO TWO TABLETS BY MOUTH EVERY 6 TO 8 HOURS AS NEEDED FOR NAUSEA AND VOMITING  90 tablet  0  . promethazine (PHENERGAN) 50 MG tablet Take 50 mg by mouth every 6 (six) hours as needed.        . Promethazine HCl (PHENERGAN PO) Take by mouth.        . TRAVATAN Z 0.004 % SOLN ophthalmic solution       . vitamin E 100 UNIT capsule Take by mouth 3 (three) times daily.          SURGICAL HISTORY: No  past surgical history on file.  REVIEW OF SYSTEMS:  Pertinent items are noted in HPI.   PHYSICAL EXAMINATION: General appearance: alert, cooperative and appears stated age Head: Normocephalic, without obvious abnormality, atraumatic Neck: no adenopathy, no carotid bruit, no JVD, supple, symmetrical, trachea midline and thyroid not enlarged, symmetric, no tenderness/mass/nodules Lymph nodes: Cervical, supraclavicular, and axillary nodes normal. Resp: clear to auscultation bilaterally and normal percussion bilaterally Back: symmetric, no curvature. ROM normal. No CVA tenderness. Cardio: regular rate and rhythm, S1, S2 normal, no murmur, click, rub or gallop and normal apical impulse GI: soft, non-tender; bowel sounds normal; no masses,  no organomegaly Extremities: extremities normal,  atraumatic, no cyanosis or edema Neurologic: Alert and oriented X 3, normal strength and tone. Normal symmetric reflexes. Normal coordination and gait Bilateral breasts are examined: Right breast no masses or nipple discharge or skin changes. Left breast well-healed surgical scar there is some darkening of the skin with redness do to radiation otherwise no masses or nipple discharge the ECOG PERFORMANCE STATUS: 0 - Asymptomatic  Blood pressure 109/76, pulse 79, temperature 97.9 F (36.6 C), height 5' 6.5" (1.689 m), weight 200 lb 11.2 oz (91.037 kg).  LABORATORY DATA: Lab Results  Component Value Date   WBC 4.3 04/13/2011   HGB 13.4 04/13/2011   HCT 38.6 04/13/2011   MCV 89.8 04/13/2011   PLT 274 04/13/2011      Chemistry      Component Value Date/Time   NA 141 04/13/2011 0938   NA 141 04/13/2011 0938   K 3.9 04/13/2011 0938   K 3.9 04/13/2011 0938   CL 108 04/13/2011 0938   CL 108 04/13/2011 0938   CO2 23 04/13/2011 0938   CO2 23 04/13/2011 0938   BUN 8 04/13/2011 0938   BUN 8 04/13/2011 0938   CREATININE 0.88 04/13/2011 0938   CREATININE 0.88 04/13/2011 0938      Component Value Date/Time   CALCIUM 9.9 04/13/2011 0938   CALCIUM 9.9 04/13/2011 0938   ALKPHOS 67 04/13/2011 0938   ALKPHOS 67 04/13/2011 0938   AST 21 04/13/2011 0938   AST 21 04/13/2011 0938   ALT 20 04/13/2011 0938   ALT 20 04/13/2011 0938   BILITOT 0.6 04/13/2011 0938   BILITOT 0.6 04/13/2011 0938       RADIOGRAPHIC STUDIES:  No results found.  ASSESSMENT: 45 year old female with stage I ER positive PR negative HER-2/neu negative invasive ductal carcinoma of the left breast status post lumpectomy with the final pathology revealing a 1.1 cm high-grade invasive ductal carcinoma with associated ductal carcinoma in situ. 4 sentinel nodes were negative for metastatic disease. Patient did have a high proliferation marker of 97%. She is now status post adjuvant chemotherapy consisting of 4 cycles of dose  dense a.c. Followed by 9 weeks of single agent weekly Taxol. Overall she tolerated the treatment relatively well even though we did have to reduce the number of cycles of Taxol she received by 3. She is now receiving radiation therapy to the left breast and is tolerating it well.   PLAN: once patient completes the radiation therapy I will see her back. At that time we will go ahead and get a Port-A-Cath. We will also start her on antiestrogen therapy. Since she is pre- menopausal by plan would be to get her started on tamoxifen 20 mg daily. A total of 5 years of therapy will be planned.   All questions were answered. The patient knows to call the clinic with any problems,  questions or concerns. We can certainly see the patient much sooner if necessary.  I spent 20 minutes counseling the patient face to face. The total time spent in the appointment was 30 minutes.    Drue Second, MD Medical/Oncology [facility] [phone] (beeper) [phone] (Office)  04/18/2011, 11:55 AM

## 2011-04-18 NOTE — Progress Notes (Signed)
Patient presents to the clinic today unaccompanied for under treat visit with Dr. Michell Heinrich. Patient is alert and oriented to person, place, and time. No distress noted. Steady gait noted. Pleasant affect noted. Patient denies pain. Patient reports some tenderness at the nipple. Patient has no other complaints. No skin changes noted to the left breast.

## 2011-04-18 NOTE — Progress Notes (Signed)
Weekly Management Note Current Dose:  37.8 Gy  Projected Dose: 61 Gy   Narrative:  The patient presents for routine under treatment assessment.  CBCT/MVCT images/Port film x-rays were reviewed.  The chart was checked. Doing well. No complaints. Some nipple tenderness but very minimal. Met with khan to discuss anti-estrogen.    Physical Findings: Weight: 204 lb 6.4 oz (92.715 kg). Unchanged. Skin is dark over left breast.   Impression:  The patient is tolerating radiation.  Plan:  Continue treatment as planned. Continue radiaplex.

## 2011-04-19 ENCOUNTER — Ambulatory Visit
Admission: RE | Admit: 2011-04-19 | Discharge: 2011-04-19 | Disposition: A | Discharge status: Home / Self Care | Payer: BC Managed Care – PPO | Source: Ambulatory Visit | Attending: Radiation Oncology | Admitting: Radiation Oncology

## 2011-04-20 ENCOUNTER — Ambulatory Visit
Admission: RE | Admit: 2011-04-20 | Discharge: 2011-04-20 | Disposition: A | Discharge status: Home / Self Care | Payer: BC Managed Care – PPO | Source: Ambulatory Visit | Attending: Radiation Oncology | Admitting: Radiation Oncology

## 2011-04-23 ENCOUNTER — Telehealth: Payer: Self-pay | Admitting: *Deleted

## 2011-04-23 ENCOUNTER — Ambulatory Visit
Admission: RE | Admit: 2011-04-23 | Discharge: 2011-04-23 | Disposition: A | Discharge status: Home / Self Care | Payer: BC Managed Care – PPO | Source: Ambulatory Visit | Attending: Radiation Oncology | Admitting: Radiation Oncology

## 2011-04-23 NOTE — Telephone Encounter (Signed)
Message copied by Cooper Render on Mon Apr 23, 2011  2:19 PM ------      Message from: Victorino December      Created: Mon Apr 23, 2011  1:43 PM       Call patient: recommend vitamin D3 2000 i=units po daily, may get this OTC

## 2011-04-23 NOTE — Telephone Encounter (Signed)
Pt.notified

## 2011-04-24 ENCOUNTER — Ambulatory Visit: Payer: BC Managed Care – PPO

## 2011-04-25 ENCOUNTER — Telehealth: Payer: Self-pay | Admitting: Genetic Counselor

## 2011-04-25 ENCOUNTER — Ambulatory Visit
Admission: RE | Admit: 2011-04-25 | Discharge: 2011-04-25 | Disposition: A | Discharge status: Home / Self Care | Payer: BC Managed Care – PPO | Source: Ambulatory Visit | Attending: Radiation Oncology | Admitting: Radiation Oncology

## 2011-04-25 ENCOUNTER — Telehealth: Payer: Self-pay | Admitting: Oncology

## 2011-04-25 DIAGNOSIS — C50319 Malignant neoplasm of lower-inner quadrant of unspecified female breast: Secondary | ICD-10-CM

## 2011-04-25 NOTE — Progress Notes (Signed)
Weekly Management Note Current Dose: 45  Gy  Projected Dose: 61 Gy   Narrative:  The patient presents for routine under treatment assessment.  CBCT/MVCT images/Port film x-rays were reviewed.  The chart was checked. Doing well. Some irritation in axilla and inframmary fold. Saw on tx machine to verify position of boost.   Physical Findings: Dark left skin. Dry desquamation in axilla and inframammary fold.  Impression:  The patient is tolerating radiation.  Plan:  Continue treatment as planned. Continue radiaplex. Pt declined further tx for these areas. Boost starts tomorrow so hopefully will heal quickly.

## 2011-04-25 NOTE — Telephone Encounter (Signed)
per Misty Stanley and Dr. Welton Flakes called pts home lmovm that her appt on 01/16 was change to 02/04 and to rtn call to confirm appt

## 2011-04-26 ENCOUNTER — Ambulatory Visit
Admission: RE | Admit: 2011-04-26 | Discharge: 2011-04-26 | Disposition: A | Discharge status: Home / Self Care | Payer: BC Managed Care – PPO | Source: Ambulatory Visit | Attending: Radiation Oncology | Admitting: Radiation Oncology

## 2011-04-27 ENCOUNTER — Ambulatory Visit
Admission: RE | Admit: 2011-04-27 | Discharge: 2011-04-27 | Disposition: A | Discharge status: Home / Self Care | Payer: BC Managed Care – PPO | Source: Ambulatory Visit | Attending: Radiation Oncology | Admitting: Radiation Oncology

## 2011-04-30 ENCOUNTER — Telehealth: Payer: Self-pay | Admitting: Oncology

## 2011-04-30 ENCOUNTER — Ambulatory Visit
Admission: RE | Admit: 2011-04-30 | Discharge: 2011-04-30 | Disposition: A | Discharge status: Home / Self Care | Payer: BC Managed Care – PPO | Source: Ambulatory Visit | Attending: Radiation Oncology | Admitting: Radiation Oncology

## 2011-04-30 NOTE — Telephone Encounter (Signed)
pt rtn call that she received message of appts on 01/16 r/s to 02/04 and need to ask more question, rtn call to pt lmovm to rtn call to me

## 2011-05-01 ENCOUNTER — Other Ambulatory Visit: Payer: Self-pay | Admitting: Oncology

## 2011-05-01 ENCOUNTER — Ambulatory Visit
Admission: RE | Admit: 2011-05-01 | Discharge: 2011-05-01 | Disposition: A | Discharge status: Home / Self Care | Payer: BC Managed Care – PPO | Source: Ambulatory Visit | Attending: Radiation Oncology | Admitting: Radiation Oncology

## 2011-05-01 VITALS — Wt 202.5 lb

## 2011-05-01 DIAGNOSIS — C50319 Malignant neoplasm of lower-inner quadrant of unspecified female breast: Secondary | ICD-10-CM

## 2011-05-01 MED ORDER — GABAPENTIN 100 MG PO CAPS: 100.0000 mg | ORAL_CAPSULE | Freq: Three times a day (TID) | ORAL | Status: DC

## 2011-05-01 NOTE — Progress Notes (Signed)
C/o pain all over from taxol, says she takes gabepentin but not working well, pain 6/10.  She will tlk to med - onc dr about this.  Skin dark red with some noted wet desquamation under breast and dry under arm, using radiaplex

## 2011-05-01 NOTE — Telephone Encounter (Signed)
Pt co/ pain numbness tingling to legs.  Reviewed with Harriett Sine, NP pt curretnly taking Neurotin 100mg  TID.  VO increase Neurotin to 200mg  TID. RX sent to Pharmacy.

## 2011-05-01 NOTE — Progress Notes (Signed)
Weekly Management Note Current Dose: 53  Gy  Projected Dose: 61 Gy   Narrative:  The patient presents for routine under treatment assessment.  CBCT/MVCT images/Port film x-rays were reviewed.  The chart was checked.  Taking neurontin but still having aches and pains in joints. Call in to Khan's office. Mosit desquamation in inframammary fold but no pain. Using neosporin with pain.   Physical Findings: Weight: 202 lb 8 oz (91.853 kg). Unchanged. Moist desquamation in inframammary folds.   Impression:  The patient is tolerating radiation.  Plan:  Continue treatment as planned. Discussed FYNN and lotion with vit e. F/u in 1 month.

## 2011-05-02 ENCOUNTER — Ambulatory Visit
Admission: RE | Admit: 2011-05-02 | Discharge: 2011-05-02 | Disposition: A | Discharge status: Home / Self Care | Payer: BC Managed Care – PPO | Source: Ambulatory Visit | Attending: Radiation Oncology | Admitting: Radiation Oncology

## 2011-05-03 ENCOUNTER — Ambulatory Visit
Admission: RE | Admit: 2011-05-03 | Discharge: 2011-05-03 | Disposition: A | Discharge status: Home / Self Care | Payer: BC Managed Care – PPO | Source: Ambulatory Visit | Attending: Radiation Oncology | Admitting: Radiation Oncology

## 2011-05-04 ENCOUNTER — Ambulatory Visit
Admission: RE | Admit: 2011-05-04 | Discharge: 2011-05-04 | Disposition: A | Discharge status: Home / Self Care | Payer: BC Managed Care – PPO | Source: Ambulatory Visit | Attending: Radiation Oncology | Admitting: Radiation Oncology

## 2011-05-07 ENCOUNTER — Ambulatory Visit
Admission: RE | Admit: 2011-05-07 | Discharge: 2011-05-07 | Disposition: A | Discharge status: Home / Self Care | Payer: BC Managed Care – PPO | Source: Ambulatory Visit | Attending: Radiation Oncology | Admitting: Radiation Oncology

## 2011-05-08 ENCOUNTER — Encounter: Payer: Self-pay | Admitting: *Deleted

## 2011-05-08 ENCOUNTER — Telehealth: Payer: Self-pay | Admitting: *Deleted

## 2011-05-08 MED ORDER — GABAPENTIN 100 MG PO CAPS: 200.0000 mg | ORAL_CAPSULE | Freq: Three times a day (TID) | ORAL | Status: DC

## 2011-05-08 NOTE — Telephone Encounter (Signed)
Confirmed with Colman Cater, NP   Called pt's Pharmacy to clarify pt instructions forNeurotin 200mg  TID future refills.  Medication due for refill week of February 8th.

## 2011-05-08 NOTE — Telephone Encounter (Signed)
pt request PAC removed- per last MD note ok to remove pac, reviewed with Harriett Sine, NP. Notified pt.  Pt advised she will call Dr. Guillermina [place] office to schedule removal of PAC

## 2011-05-08 NOTE — Telephone Encounter (Signed)
Called pt regarding Neurotin dose- Per pt "i was previously taking neurotin 200mg  BID, but then the pain seemed like it was getting worse so they increased it to 200mg  TID and it's really helping" Pt advised she is taking Neurotin 200mg  TID.

## 2011-05-09 ENCOUNTER — Ambulatory Visit: Payer: BC Managed Care – PPO | Admitting: Oncology

## 2011-05-09 ENCOUNTER — Other Ambulatory Visit: Payer: BC Managed Care – PPO | Admitting: Lab

## 2011-05-18 ENCOUNTER — Other Ambulatory Visit (HOSPITAL_BASED_OUTPATIENT_CLINIC_OR_DEPARTMENT_OTHER): Payer: BC Managed Care – PPO | Admitting: Lab

## 2011-05-18 ENCOUNTER — Encounter: Payer: Self-pay | Admitting: Oncology

## 2011-05-18 ENCOUNTER — Ambulatory Visit (HOSPITAL_BASED_OUTPATIENT_CLINIC_OR_DEPARTMENT_OTHER): Payer: BC Managed Care – PPO | Admitting: Oncology

## 2011-05-18 VITALS — BP 114/81 | HR 76 | Temp 97.8°F | Ht 66.5 in | Wt 200.1 lb

## 2011-05-18 DIAGNOSIS — R635 Abnormal weight gain: Secondary | ICD-10-CM

## 2011-05-18 DIAGNOSIS — IMO0001 Reserved for inherently not codable concepts without codable children: Secondary | ICD-10-CM

## 2011-05-18 DIAGNOSIS — G629 Polyneuropathy, unspecified: Secondary | ICD-10-CM

## 2011-05-18 DIAGNOSIS — C50919 Malignant neoplasm of unspecified site of unspecified female breast: Secondary | ICD-10-CM

## 2011-05-18 LAB — COMPREHENSIVE METABOLIC PANEL
Albumin: 4.4 g/dL (ref 3.5–5.2)
CO2: 24 mEq/L (ref 19–32)
Calcium: 9.8 mg/dL (ref 8.4–10.5)
Chloride: 106 mEq/L (ref 96–112)
Glucose, Bld: 86 mg/dL (ref 70–99)
Sodium: 140 mEq/L (ref 135–145)
Total Bilirubin: 0.6 mg/dL (ref 0.3–1.2)
Total Protein: 7.1 g/dL (ref 6.0–8.3)

## 2011-05-18 LAB — CBC WITH DIFFERENTIAL/PLATELET
Eosinophils Absolute: 0.2 10*3/uL (ref 0.0–0.5)
HCT: 40.7 % (ref 34.8–46.6)
LYMPH%: 24.8 % (ref 14.0–49.7)
MONO#: 0.5 10*3/uL (ref 0.1–0.9)
NEUT#: 3.7 10*3/uL (ref 1.5–6.5)
Platelets: 266 10*3/uL (ref 145–400)
RBC: 4.58 10*6/uL (ref 3.70–5.45)
WBC: 5.8 10*3/uL (ref 3.9–10.3)
lymph#: 1.4 10*3/uL (ref 0.9–3.3)

## 2011-05-18 MED ORDER — OXYCODONE HCL 5 MG PO TABS: 5.0000 mg | ORAL_TABLET | ORAL | Status: AC | PRN

## 2011-05-18 MED ORDER — TAMOXIFEN CITRATE 20 MG PO TABS: 20.0000 mg | ORAL_TABLET | Freq: Every day | ORAL | Status: DC

## 2011-05-18 MED ORDER — GABAPENTIN 300 MG PO CAPS: 300.0000 mg | ORAL_CAPSULE | Freq: Three times a day (TID) | ORAL | Status: DC

## 2011-05-21 NOTE — Progress Notes (Signed)
OFFICE PROGRESS NOTE  CC Dr. Lurline Hare Dr. Harriette Bouillon [name],[name], MD, MD [address] [place] [address]  DIAGNOSIS: 46 year old female with stage I (TI A. 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  PRIOR THERAPY:  #1 patient underwent a left breast lumpectomy with sentinel node biopsy with the final pathology revealing a high-grade invasive ductal carcinoma measuring 1.1 cm with associated high-grade ductal carcinoma in situ with 4 sentinel nodes were negative for metastatic disease. The tumor was ER positive PR negative HER-2/neu negative with a proliferation marker 97%.  #2 status post 4 cycles of dose dense Adriamycin Cytoxan  #3 status post 9 weeks of single agent Taxol. Her last treatment was given on 02/26/2011. Total of 12 weeks of Taxol were planned but do to significant grade 3 peripheral neuropathy the treatment was stopped earlier  #4 S/P radiation therapy to the left breast completed on 05/07/11  CURRENT THERAPY: Tamoxifen 20 mg po daily starting 05/18/11  INTERVAL HISTORY: Heather Schaefer 46 y.o. female returns for followup visit today. Overall she tolerated the radiation well. However patient has considerable complaints with myalgias and arthralgias. She has not been sleeping very well. She was restarted on Neurontin 300 mg by mouth however in spite of that she still continues to have problems. She also is quite concerned about her weight. And she is very much interested in weight loss. She denies any fevers chills night sweats headaches shortness of breath chest pains palpitations. She does have ongoing problems with peripheral paresthesias. Certainly the Neurontin does help her. She has not is restarted her menstrual cycles yet. She thinks she has gone through menopause. She is having hot flashes. Remainder of the 10 point review of systems is negative.  MEDICAL HISTORY: Past  Medical History  Diagnosis Date  . Breast cancer   . Glaucoma   . Breast cancer, female 02/23/2011    ALLERGIES:  is allergic to hydrocodone.  MEDICATIONS:  Current Outpatient Prescriptions  Medication Sig Dispense Refill  . b complex vitamins capsule Take 1 capsule by mouth daily.        . cephALEXin (KEFLEX) 500 MG capsule       . cholecalciferol (VITAMIN D) 1000 UNITS tablet Take 1,000 Units by mouth daily.        Marland Kitchen dextromethorphan-guaiFENesin (MUCINEX DM) 30-600 MG per 12 hr tablet Take 1 tablet by mouth every 12 (twelve) hours.        Marland Kitchen doxycycline (VIBRAMYCIN) 100 MG capsule       . fluconazole (DIFLUCAN) 100 MG tablet       . folic acid (FOLVITE) 1 MG tablet Take 1 mg by mouth daily.        Marland Kitchen gabapentin (NEURONTIN) 100 MG capsule Take 2 capsules (200 mg total) by mouth 3 (three) times daily.  180 capsule  2  . gabapentin (NEURONTIN) 300 MG capsule Take 1 capsule (300 mg total) by mouth 3 (three) times daily.  30 capsule  12  . ibuprofen (ADVIL,MOTRIN) 200 MG tablet Take 200 mg by mouth every 6 (six) hours as needed.        . lidocaine-prilocaine (EMLA) cream Apply topically as needed.        . loratadine (CLARITIN) 10 MG tablet Take 10 mg by mouth daily.        Marland Kitchen LORazepam (ATIVAN) 0.5 MG tablet TAKE ONE TO TWO TABLETS BY MOUTH EVERY 6 TO 8 HOURS AS NEEDED FOR  NAUSEA AND VOMITING  90 tablet  0  . oxyCODONE (OXY IR/ROXICODONE) 5 MG immediate release tablet Take 1 tablet (5 mg total) by mouth every 4 (four) hours as needed for pain.  60 tablet  0  . promethazine (PHENERGAN) 50 MG tablet Take 50 mg by mouth every 6 (six) hours as needed.        . Promethazine HCl (PHENERGAN PO) Take by mouth.        . tamoxifen (NOLVADEX) 20 MG tablet Take 1 tablet (20 mg total) by mouth daily.  30 tablet  12  . TRAVATAN Z 0.004 % SOLN ophthalmic solution       . vitamin E 100 UNIT capsule Take by mouth 3 (three) times daily.          SURGICAL HISTORY: No past surgical history on file.  REVIEW  OF SYSTEMS:  Pertinent items are noted in HPI.   PHYSICAL EXAMINATION: General appearance: alert, cooperative and appears stated age Head: Normocephalic, without obvious abnormality, atraumatic Neck: no adenopathy, no carotid bruit, no JVD, supple, symmetrical, trachea midline and thyroid not enlarged, symmetric, no tenderness/mass/nodules Lymph nodes: Cervical, supraclavicular, and axillary nodes normal. Resp: clear to auscultation bilaterally and normal percussion bilaterally Back: symmetric, no curvature. ROM normal. No CVA tenderness. Cardio: regular rate and rhythm, S1, S2 normal, no murmur, click, rub or gallop and normal apical impulse GI: soft, non-tender; bowel sounds normal; no masses,  no organomegaly Extremities: extremities normal, atraumatic, no cyanosis or edema Neurologic: Alert and oriented X 3, normal strength and tone. Normal symmetric reflexes. Normal coordination and gait Bilateral breasts are examined: Right breast no masses or nipple discharge or skin changes. Left breast well-healed surgical scar there is some darkening of the skin with redness do to radiation otherwise no masses or nipple discharge the ECOG PERFORMANCE STATUS: 0 - Asymptomatic  Blood pressure 114/81, pulse 76, temperature 97.8 F (36.6 C), temperature source Oral, height 5' 6.5" (1.689 m), weight 200 lb 1.6 oz (90.765 kg).  LABORATORY DATA: Lab Results  Component Value Date   WBC 5.8 05/18/2011   HGB 14.0 05/18/2011   HCT 40.7 05/18/2011   MCV 88.7 05/18/2011   PLT 266 05/18/2011      Chemistry      Component Value Date/Time   NA 140 05/18/2011 1146   K 4.2 05/18/2011 1146   CL 106 05/18/2011 1146   CO2 24 05/18/2011 1146   BUN 12 05/18/2011 1146   CREATININE 0.92 05/18/2011 1146      Component Value Date/Time   CALCIUM 9.8 05/18/2011 1146   ALKPHOS 74 05/18/2011 1146   AST 23 05/18/2011 1146   ALT 23 05/18/2011 1146   BILITOT 0.6 05/18/2011 1146       RADIOGRAPHIC STUDIES:  No results  found.  ASSESSMENT: 46 year old female with:  1.  stage I ER positive PR negative HER-2/neu negative invasive ductal carcinoma of the left breast status post lumpectomy with the final pathology revealing a 1.1 cm high-grade invasive ductal carcinoma with associated ductal carcinoma in situ. 4 sentinel nodes were negative for metastatic disease. Patient did have a high proliferation marker of 97%.   2.She is now status post adjuvant chemotherapy consisting of 4 cycles of dose dense a.c. Followed by 9 weeks of single agent weekly Taxol. Overall she tolerated the treatment relatively well even though we did have to reduce the number of cycles of Taxol she received by 3.  3.patient has completed radiation therapy to the left breast overall  she tolerated it very well 4. Myalgias and arthralgias 5. Weight gain   PLAN: #1 patient has now completed radiation and therefore she will begin on adjuvant endocrine therapy this will consist of tamoxifen 20 mg daily. Risks and benefits of tamoxifen have been discussed with the patient and her husband very clearly. She was given a prescription for this.  #2 myalgias and arthralgias I have increased her dose of Neurontin to 300 mg 3 times a day hopefully this will help her. We also discussed exercise.  #3 patient and I discussed extensively exercise and diet plan. I do think that she would benefit from our survivor clinic and I have referred her to Colman Cater for further discussions.  #4 patient will have her Port-A-Cath removed and she is referred back to Dr. Luisa Hart  #5 patient will be seeing me after she sees Colman Cater in followup.  #6 patient knows to call me with any problems questions or concerns   All questions were answered. The patient knows to call the clinic with any problems, questions or concerns. We can certainly see the patient much sooner if necessary.  I spent 20 minutes counseling the patient face to face. The total time spent in  the appointment was 30 minutes.    Drue Second, MD Medical/Oncology [facility] [phone] (beeper) [phone] (Office)  05/21/2011, 8:32 AM

## 2011-05-28 ENCOUNTER — Encounter (INDEPENDENT_AMBULATORY_CARE_PROVIDER_SITE_OTHER): Payer: Self-pay | Admitting: Surgery

## 2011-05-28 ENCOUNTER — Ambulatory Visit: Payer: BC Managed Care – PPO | Admitting: Oncology

## 2011-05-28 ENCOUNTER — Other Ambulatory Visit: Payer: BC Managed Care – PPO | Admitting: Lab

## 2011-05-28 ENCOUNTER — Ambulatory Visit (INDEPENDENT_AMBULATORY_CARE_PROVIDER_SITE_OTHER): Payer: BC Managed Care – PPO | Admitting: Surgery

## 2011-05-28 ENCOUNTER — Encounter (HOSPITAL_BASED_OUTPATIENT_CLINIC_OR_DEPARTMENT_OTHER): Payer: Self-pay | Admitting: *Deleted

## 2011-05-28 VITALS — BP 122/84 | HR 89 | Temp 97.4°F | Ht 66.5 in | Wt 198.0 lb

## 2011-05-28 DIAGNOSIS — Z853 Personal history of malignant neoplasm of breast: Secondary | ICD-10-CM

## 2011-05-28 NOTE — Progress Notes (Signed)
Called dr cornette-pt had cmet 05/18/11-do we need a new one-no-do not need new labs

## 2011-05-28 NOTE — Patient Instructions (Signed)
You will be scheduled for port removal. 

## 2011-05-28 NOTE — Progress Notes (Signed)
Patient ID: Heather Schaefer, female   DOB: 1965-05-22, 46 y.o.   MRN: [id number]  Chief Complaint  Patient presents with  . Follow-up    PD cath removal    HPI Heather Schaefer is a 46 y.o. female.   HPI The patient returns and long-term followup to 2 history of left breast cancer diagnosed in 2012 treated with lumpectomy, sentinel lymph node mapping and postop radiation therapy with chemotherapy. She is done with chemotherapy and needs her port removed.  Past Medical History  Diagnosis Date  . Breast cancer   . Glaucoma   . Breast cancer, female 02/23/2011  . Nasal congestion   . Cough     Past Surgical History  Procedure Date  . Breast surgery   . Laparoscopic endometriosis fulguration 2001    History reviewed. No pertinent family history.  Social History History  Substance Use Topics  . Smoking status: Former Smoker    Quit date: 05/27/2001  . Smokeless tobacco: Not on file  . Alcohol Use: Yes     1-2 week    Allergies  Allergen Reactions  . Hydrocodone Nausea And Vomiting    Current Outpatient Prescriptions  Medication Sig Dispense Refill  . b complex vitamins capsule Take 1 capsule by mouth daily.        . cholecalciferol (VITAMIN D) 1000 UNITS tablet Take 1,000 Units by mouth daily.        Marland Kitchen dextromethorphan-guaiFENesin (MUCINEX DM) 30-600 MG per 12 hr tablet Take 1 tablet by mouth every 12 (twelve) hours.        . folic acid (FOLVITE) 1 MG tablet Take 1 mg by mouth daily.        Marland Kitchen gabapentin (NEURONTIN) 300 MG capsule Take 1 capsule (300 mg total) by mouth 3 (three) times daily.  30 capsule  12  . ibuprofen (ADVIL,MOTRIN) 200 MG tablet Take 200 mg by mouth every 6 (six) hours as needed.        . loratadine (CLARITIN) 10 MG tablet Take 10 mg by mouth daily.        Marland Kitchen LORazepam (ATIVAN) 0.5 MG tablet TAKE ONE TO TWO TABLETS BY MOUTH EVERY 6 TO 8 HOURS AS NEEDED FOR NAUSEA AND VOMITING  90 tablet  0  . oxyCODONE (OXY IR/ROXICODONE) 5 MG immediate release tablet  Take 1 tablet (5 mg total) by mouth every 4 (four) hours as needed for pain.  60 tablet  0  . tamoxifen (NOLVADEX) 20 MG tablet Take 1 tablet (20 mg total) by mouth daily.  30 tablet  12  . TRAVATAN Z 0.004 % SOLN ophthalmic solution       . vitamin E 100 UNIT capsule Take by mouth 3 (three) times daily.          Review of Systems Review of Systems  Constitutional: Negative for fever, chills and unexpected weight change.  HENT: Negative for hearing loss, congestion, sore throat, trouble swallowing and voice change.   Eyes: Negative for visual disturbance.  Respiratory: Negative for cough and wheezing.   Cardiovascular: Negative for chest pain, palpitations and leg swelling.  Gastrointestinal: Negative for nausea, vomiting, abdominal pain, diarrhea, constipation, blood in stool, abdominal distention and anal bleeding.  Genitourinary: Negative for hematuria, vaginal bleeding and difficulty urinating.  Musculoskeletal: Negative for arthralgias.  Skin: Negative for rash and wound.  Neurological: Negative for seizures, syncope and headaches.  Hematological: Negative for adenopathy. Does not bruise/bleed easily.  Psychiatric/Behavioral: Negative for confusion.    Blood  pressure 122/84, pulse 89, temperature 97.4 F (36.3 C), temperature source Temporal, height 5' 6.5" (1.689 m), weight 198 lb (89.812 kg), SpO2 97.00%.  Physical Exam Physical Exam  Constitutional: She is oriented to person, place, and time. She appears well-developed and well-nourished.  HENT:  Head: Normocephalic and atraumatic.  Eyes: EOM are normal. Pupils are equal, round, and reactive to light.  Neck: Normal range of motion. Neck supple.  Pulmonary/Chest:       Left breast shows postsurgical changes without mass. Good cosmetic result. Right chest is Port-A-Cath in place.  Musculoskeletal: Normal range of motion.  Neurological: She is alert and oriented to person, place, and time.  Skin: Skin is warm and dry.    Psychiatric: She has a normal mood and affect. Her behavior is normal. Judgment and thought content normal.    Data Reviewed   Assessment    History of breast cancer stage I  Port-A-Cath    Plan    Remove Port-A-Cath.The procedure has been discussed with the patient.  Alternative therapies have been discussed with the patient.  Operative risks include bleeding,  Infection,  Organ injury,  Nerve injury,  Blood vessel injury,  DVT,  Pulmonary embolism,  Death,  And possible reoperation.  Medical management risks include worsening of present situation. .  The patient understands and agrees to proceed.       Shatoya Roets A. 05/28/2011, 10:57 AM

## 2011-05-29 ENCOUNTER — Encounter (HOSPITAL_BASED_OUTPATIENT_CLINIC_OR_DEPARTMENT_OTHER): Payer: Self-pay | Admitting: Anesthesiology

## 2011-05-29 ENCOUNTER — Encounter (HOSPITAL_BASED_OUTPATIENT_CLINIC_OR_DEPARTMENT_OTHER)
Admission: RE | Disposition: A | Discharge status: Home / Self Care | Payer: Self-pay | Source: Ambulatory Visit | Attending: Surgery

## 2011-05-29 ENCOUNTER — Ambulatory Visit (HOSPITAL_BASED_OUTPATIENT_CLINIC_OR_DEPARTMENT_OTHER)
Admission: RE | Admit: 2011-05-29 | Discharge: 2011-05-29 | Disposition: A | Discharge status: Home / Self Care | Payer: BC Managed Care – PPO | Source: Ambulatory Visit | Attending: Surgery | Admitting: Surgery

## 2011-05-29 ENCOUNTER — Encounter (HOSPITAL_BASED_OUTPATIENT_CLINIC_OR_DEPARTMENT_OTHER): Payer: Self-pay | Admitting: Surgery

## 2011-05-29 DIAGNOSIS — C50319 Malignant neoplasm of lower-inner quadrant of unspecified female breast: Secondary | ICD-10-CM

## 2011-05-29 DIAGNOSIS — C50919 Malignant neoplasm of unspecified site of unspecified female breast: Secondary | ICD-10-CM

## 2011-05-29 DIAGNOSIS — Z452 Encounter for adjustment and management of vascular access device: Secondary | ICD-10-CM | POA: Insufficient documentation

## 2011-05-29 DIAGNOSIS — Z9221 Personal history of antineoplastic chemotherapy: Secondary | ICD-10-CM | POA: Insufficient documentation

## 2011-05-29 HISTORY — PX: PORT-A-CATH REMOVAL: SHX5289

## 2011-05-29 HISTORY — DX: Other specified health status: Z78.9

## 2011-05-29 SURGERY — MINOR REMOVAL PORT-A-CATH
Anesthesia: LOCAL | Site: Chest | Wound class: Clean

## 2011-05-29 MED ORDER — CEFAZOLIN SODIUM 1-5 GM-% IV SOLN: 1.0000 g | INTRAVENOUS | Status: DC

## 2011-05-29 MED ORDER — LACTATED RINGERS IV SOLN: INTRAVENOUS | Status: DC

## 2011-05-29 MED ORDER — BUPIVACAINE-EPINEPHRINE 0.25% -1:200000 IJ SOLN
INTRAMUSCULAR | Status: DC | PRN
  Administered 2011-05-29: 20 mL

## 2011-05-29 SURGICAL SUPPLY — 35 items
ADH SKN CLS APL DERMABOND .7 (GAUZE/BANDAGES/DRESSINGS) ×2
APL SKNCLS STERI-STRIP NONHPOA (GAUZE/BANDAGES/DRESSINGS)
BENZOIN TINCTURE PRP APPL 2/3 (GAUZE/BANDAGES/DRESSINGS) IMPLANT
BLADE SURG 15 STRL LF DISP TIS (BLADE) ×2 IMPLANT
BLADE SURG 15 STRL SS (BLADE) ×3
CHLORAPREP W/TINT 26ML (MISCELLANEOUS) ×3 IMPLANT
CLOTH BEACON ORANGE TIMEOUT ST (SAFETY) ×3 IMPLANT
COVER MAYO STAND STRL (DRAPES) ×3 IMPLANT
COVER TABLE BACK 60X90 (DRAPES) ×3 IMPLANT
DECANTER SPIKE VIAL GLASS SM (MISCELLANEOUS) ×3 IMPLANT
DERMABOND ADVANCED (GAUZE/BANDAGES/DRESSINGS) ×1
DERMABOND ADVANCED .7 DNX12 (GAUZE/BANDAGES/DRESSINGS) ×2 IMPLANT
DRAPE PED LAPAROTOMY (DRAPES) ×3 IMPLANT
DRAPE UTILITY XL STRL (DRAPES) ×3 IMPLANT
ELECT REM PT RETURN 9FT ADLT (ELECTROSURGICAL) ×3
ELECTRODE REM PT RTRN 9FT ADLT (ELECTROSURGICAL) ×2 IMPLANT
GLOVE BIOGEL PI IND STRL 8 (GLOVE) ×2 IMPLANT
GLOVE BIOGEL PI INDICATOR 8 (GLOVE) ×1
GLOVE ECLIPSE 6.5 STRL STRAW (GLOVE) ×3 IMPLANT
GLOVE ECLIPSE 8.0 STRL XLNG CF (GLOVE) ×3 IMPLANT
GOWN PREVENTION PLUS XLARGE (GOWN DISPOSABLE) ×3 IMPLANT
NEEDLE HYPO 25X1 1.5 SAFETY (NEEDLE) ×3 IMPLANT
NS IRRIG 1000ML POUR BTL (IV SOLUTION) ×3 IMPLANT
PACK BASIN DAY SURGERY FS (CUSTOM PROCEDURE TRAY) ×3 IMPLANT
PENCIL BUTTON HOLSTER BLD 10FT (ELECTRODE) IMPLANT
SLEEVE SCD COMPRESS KNEE MED (MISCELLANEOUS) IMPLANT
SPONGE LAP 4X18 X RAY DECT (DISPOSABLE) ×3 IMPLANT
STRIP CLOSURE SKIN 1/2X4 (GAUZE/BANDAGES/DRESSINGS) IMPLANT
SUT MON AB 4-0 PC3 18 (SUTURE) ×3 IMPLANT
SUT VIC AB 3-0 SH 27 (SUTURE)
SUT VIC AB 3-0 SH 27X BRD (SUTURE) IMPLANT
SYR CONTROL 10ML LL (SYRINGE) ×3 IMPLANT
TOWEL OR 17X24 6PK STRL BLUE (TOWEL DISPOSABLE) ×3 IMPLANT
TOWEL OR NON WOVEN STRL DISP B (DISPOSABLE) ×3 IMPLANT
WATER STERILE IRR 1000ML POUR (IV SOLUTION) ×3 IMPLANT

## 2011-05-29 NOTE — Op Note (Signed)
Preop diagnosis: Indwelling port a catheter for chemotherapy  Postop diagnosis: Same  Procedure: Removal of port a catheter  Surgeon: Andreika Vandagriff M.D.  Anesthesia: MAC with local  EBL: Minimal  Specimen none  Drains: None  Indications for procedure: The patient presents for removal of port a catheter after completing chemotherapy. The patient no longer requires central venous access. Risks of bleeding, infection, catheter fragmentation, embolization, arrhythmias and damage to arteries, veins and nerves and possibly other mediastinal structures discussed. The patient agrees to proceed.  Description of procedure: The patient was seen in the holding area. Questions were answered. The patient agreed to proceed. The patient was taken to the operating room. The patient was placed supine. Anesthesia was initiated. The skin on the upper chest was prepped and draped in a sterile fashion. Timeout was done. The patient received preoperative antibiotics. Incision was made through the old port site and the hub of the Port-A-Cath was seen. The sutures were cut to release the port from the chest wall. The catheter was removed in its entirety without difficulty. The tract was closed with 3-0 Vicryl. 4 Monocryl was used to close the skin. All final counts were correct. The patient was taken to recovery in satisfactory condition.  

## 2011-05-29 NOTE — Interval H&P Note (Signed)
History and Physical Interval Note:  05/29/2011 2:18 PM  Heather Schaefer  has presented today for surgery, with the diagnosis of breast cancer   The various methods of treatment have been discussed with the patient and family. After consideration of risks, benefits and other options for treatment, the patient has consented to  Procedure(s): REMOVAL PORT-A-CATH as a surgical intervention .  The patients' history has been reviewed, patient examined, no change in status, stable for surgery.  I have reviewed the patients' chart and labs.  Questions were answered to the patient's satisfaction.     Mari Battaglia A.

## 2011-05-29 NOTE — H&P (View-Only) (Signed)
Patient ID: Heather Schaefer, female   DOB: 11/10/1965, 45 y.o.   MRN: [id number]  Chief Complaint  Patient presents with  . Follow-up    PD cath removal    HPI Heather Schaefer is a 45 y.o. female.   HPI The patient returns and long-term followup to 2 history of left breast cancer diagnosed in 2012 treated with lumpectomy, sentinel lymph node mapping and postop radiation therapy with chemotherapy. She is done with chemotherapy and needs her port removed.  Past Medical History  Diagnosis Date  . Breast cancer   . Glaucoma   . Breast cancer, female 02/23/2011  . Nasal congestion   . Cough     Past Surgical History  Procedure Date  . Breast surgery   . Laparoscopic endometriosis fulguration 2001    History reviewed. No pertinent family history.  Social History History  Substance Use Topics  . Smoking status: Former Smoker    Quit date: 05/27/2001  . Smokeless tobacco: Not on file  . Alcohol Use: Yes     1-2 week    Allergies  Allergen Reactions  . Hydrocodone Nausea And Vomiting    Current Outpatient Prescriptions  Medication Sig Dispense Refill  . b complex vitamins capsule Take 1 capsule by mouth daily.        . cholecalciferol (VITAMIN D) 1000 UNITS tablet Take 1,000 Units by mouth daily.        . dextromethorphan-guaiFENesin (MUCINEX DM) 30-600 MG per 12 hr tablet Take 1 tablet by mouth every 12 (twelve) hours.        . folic acid (FOLVITE) 1 MG tablet Take 1 mg by mouth daily.        . gabapentin (NEURONTIN) 300 MG capsule Take 1 capsule (300 mg total) by mouth 3 (three) times daily.  30 capsule  12  . ibuprofen (ADVIL,MOTRIN) 200 MG tablet Take 200 mg by mouth every 6 (six) hours as needed.        . loratadine (CLARITIN) 10 MG tablet Take 10 mg by mouth daily.        . LORazepam (ATIVAN) 0.5 MG tablet TAKE ONE TO TWO TABLETS BY MOUTH EVERY 6 TO 8 HOURS AS NEEDED FOR NAUSEA AND VOMITING  90 tablet  0  . oxyCODONE (OXY IR/ROXICODONE) 5 MG immediate release tablet  Take 1 tablet (5 mg total) by mouth every 4 (four) hours as needed for pain.  60 tablet  0  . tamoxifen (NOLVADEX) 20 MG tablet Take 1 tablet (20 mg total) by mouth daily.  30 tablet  12  . TRAVATAN Z 0.004 % SOLN ophthalmic solution       . vitamin E 100 UNIT capsule Take by mouth 3 (three) times daily.          Review of Systems Review of Systems  Constitutional: Negative for fever, chills and unexpected weight change.  HENT: Negative for hearing loss, congestion, sore throat, trouble swallowing and voice change.   Eyes: Negative for visual disturbance.  Respiratory: Negative for cough and wheezing.   Cardiovascular: Negative for chest pain, palpitations and leg swelling.  Gastrointestinal: Negative for nausea, vomiting, abdominal pain, diarrhea, constipation, blood in stool, abdominal distention and anal bleeding.  Genitourinary: Negative for hematuria, vaginal bleeding and difficulty urinating.  Musculoskeletal: Negative for arthralgias.  Skin: Negative for rash and wound.  Neurological: Negative for seizures, syncope and headaches.  Hematological: Negative for adenopathy. Does not bruise/bleed easily.  Psychiatric/Behavioral: Negative for confusion.    Blood   pressure 122/84, pulse 89, temperature 97.4 F (36.3 C), temperature source Temporal, height 5' 6.5" (1.689 m), weight 198 lb (89.812 kg), SpO2 97.00%.  Physical Exam Physical Exam  Constitutional: She is oriented to person, place, and time. She appears well-developed and well-nourished.  HENT:  Head: Normocephalic and atraumatic.  Eyes: EOM are normal. Pupils are equal, round, and reactive to light.  Neck: Normal range of motion. Neck supple.  Pulmonary/Chest:       Left breast shows postsurgical changes without mass. Good cosmetic result. Right chest is Port-A-Cath in place.  Musculoskeletal: Normal range of motion.  Neurological: She is alert and oriented to person, place, and time.  Skin: Skin is warm and dry.    Psychiatric: She has a normal mood and affect. Her behavior is normal. Judgment and thought content normal.    Data Reviewed   Assessment    History of breast cancer stage I  Port-A-Cath    Plan    Remove Port-A-Cath.The procedure has been discussed with the patient.  Alternative therapies have been discussed with the patient.  Operative risks include bleeding,  Infection,  Organ injury,  Nerve injury,  Blood vessel injury,  DVT,  Pulmonary embolism,  Death,  And possible reoperation.  Medical management risks include worsening of present situation. .  The patient understands and agrees to proceed.       Daksh Coates A. 05/28/2011, 10:57 AM    

## 2011-05-29 NOTE — Anesthesia Preprocedure Evaluation (Addendum)
Anesthesia Evaluation  Patient identified by MRN, date of birth, ID band Patient awake    Reviewed: Allergy & Precautions, H&P , NPO status , Patient's Chart, lab work & pertinent test results  Airway Mallampati: II TM Distance: >3 FB Neck ROM: Full    Dental No notable dental hx. (+) Teeth Intact   Pulmonary neg pulmonary ROS,  clear to auscultation  Pulmonary exam normal       Cardiovascular neg cardio ROS Regular Normal    Neuro/Psych Negative Neurological ROS  Negative Psych ROS   GI/Hepatic negative GI ROS, Neg liver ROS,   Endo/Other  Negative Endocrine ROS  Renal/GU negative Renal ROS  Genitourinary negative   Musculoskeletal   Abdominal   Peds  Hematology negative hematology ROS (+)   Anesthesia Other Findings   Reproductive/Obstetrics negative OB ROS                           Anesthesia Physical Anesthesia Plan  ASA:   Anesthesia Plan:    Post-op Pain Management:    Induction:   Airway Management Planned:   Additional Equipment:   Intra-op Plan:   Post-operative Plan:   Informed Consent: I have reviewed the patients History and Physical, chart, labs and discussed the procedure including the risks, benefits and alternatives for the proposed anesthesia with the patient or authorized representative who has indicated his/her understanding and acceptance.     Plan Discussed with: CRNA  Anesthesia Plan Comments: (Case plan changed to straight local.)       Anesthesia Quick Evaluation

## 2011-05-31 ENCOUNTER — Encounter (HOSPITAL_BASED_OUTPATIENT_CLINIC_OR_DEPARTMENT_OTHER): Payer: Self-pay | Admitting: Surgery

## 2011-05-31 ENCOUNTER — Ambulatory Visit
Admission: RE | Admit: 2011-05-31 | Discharge: 2011-05-31 | Disposition: A | Discharge status: Home / Self Care | Payer: BC Managed Care – PPO | Source: Ambulatory Visit | Attending: Radiation Oncology | Admitting: Radiation Oncology

## 2011-05-31 DIAGNOSIS — C50319 Malignant neoplasm of lower-inner quadrant of unspecified female breast: Secondary | ICD-10-CM

## 2011-05-31 NOTE — Progress Notes (Signed)
HERE TODAY FOR FU OF LEFT BREAST CA.  CONTINUES TO HAVE TROUBLE SLEEPING.  SKIN LOOKS GREAT

## 2011-06-01 NOTE — Progress Notes (Signed)
PREVIOUS RADIATION:  To a total dose of 61 Gy completed 05/07/2011.  INTERVAL SINCE TREATMENT:  One month.  INTERVAL HISTORY:  Heather Schaefer reports for followup today.  She is feeling well and doing well.  She is participating in our [facility] class and really enjoyed her t          'ai chi and yoga.  She is pleased with her cosmetic result.  Her skin has healed up well.  She has some very minimal soreness across her axilla and into her back.  Other than that, doing well.  She started her tamoxifen under the care of Dr. Welton Flakes.  PHYSICAL EXAMINATION:  Really has an excellent cosmetic result.  There is only some dark tanning in her inframammary folds and axilla. Otherwise her skin has healed up nicely.  IMPRESSION:  T1b N0 left breast cancer status post radiation with resolving acute effects of treatment.  RECOMMENDATIONS:  [name] looks great.  She has a mammogram scheduled for early March.  I will plan on seeing her back in 6 months.  I encouraged her to call me with any questions in the interim.  She has agreed to do so.    ______________________________ Lurline Hare, M.D. SW/MEDQ  D:  05/31/2011  T:  05/31/2011  Job:  [id number]

## 2011-06-02 ENCOUNTER — Encounter: Payer: Self-pay | Admitting: Radiation Oncology

## 2011-06-02 NOTE — Progress Notes (Signed)
Name: TURQUOISE ESCH   MRN: [id number]  Date:  04/18/2011   DOB: Jul 26, 1965  Status:outpatient    DIAGNOSIS: Breast cancer.  CONSENT VERIFIED: yes   SET UP: Patient is setup supine   IMMOBILIZATION:  The following immobilization was used:Custom Moldable Pillow, breast board.   NARRATIVE: Theodis Blaze underwent complex simulation and treatment planning for her boost treatment on 04/18/2011.  Her tumor volume was outlined on the planning CT scan. The dept of her cavity was felt to be adequate for electron coverage.   15  MeV electrons will be prescribed to the 98% Isodose line.   A block will be used for beam modification purposes.  A special port plan is requested.

## 2011-06-02 NOTE — Progress Notes (Signed)
CC:   Heather Schaefer, M.D. Thomas A. Cornett, M.D.  DIAGNOSIS:  Left breast cancer.  TREATMENT DATES:  03/20/2011 to 05/07/2011.  ANATOMIC REGION TREATED: 1. Left breast. 2. Left breast boost.  DOSE: 1. 45 Gy at 1.8 Gy per fraction x25 fractions. 2. 16 Gy at 2 Gy per fraction x8 fractions.  BEAM ARRANGEMENT: 1. Opposed tangents with reduced fields. 2. En face electrons.  BEAM ENERGY: 1. 6 and 10 MV photons. 2. 15 MeV electrons.  TREATMENT TOLERANCE:  Heather Schaefer tolerated her treatment remarkably well. She had the expected dry desquamation which was treated with RadiaPlex.  FOLLOWUP:  I will plan on seeing her back in 1 month's time.  She also has regular scheduled followup with Dr. Welton Flakes.  She knows to contact me in the interim with any questions or concerns.    ______________________________ Heather Schaefer, M.D. SW/MEDQ  D:  06/02/2011  T:  06/02/2011  Job:  [id number]

## 2011-06-13 ENCOUNTER — Other Ambulatory Visit: Payer: Self-pay | Admitting: *Deleted

## 2011-06-13 MED ORDER — OXYCODONE HCL 5 MG PO TABS: 5.0000 mg | ORAL_TABLET | ORAL | Status: AC | PRN

## 2011-06-14 ENCOUNTER — Telehealth: Payer: Self-pay | Admitting: *Deleted

## 2011-06-14 NOTE — Telephone Encounter (Signed)
Called pt, lmovm for Pt to treat cold symtoms, taking tylenol if having fever, try mucinex for congestion, chicken noodle soup. Drink plenty of non-caffinated fluids Pt call with concerns or clarification. Keep scheduled apt with NR on 06/21/11

## 2011-06-19 ENCOUNTER — Other Ambulatory Visit: Payer: Self-pay | Admitting: Oncology

## 2011-06-19 DIAGNOSIS — Z853 Personal history of malignant neoplasm of breast: Secondary | ICD-10-CM

## 2011-06-19 DIAGNOSIS — Z9889 Other specified postprocedural states: Secondary | ICD-10-CM

## 2011-06-21 ENCOUNTER — Ambulatory Visit (HOSPITAL_BASED_OUTPATIENT_CLINIC_OR_DEPARTMENT_OTHER): Payer: BC Managed Care – PPO | Admitting: Family

## 2011-06-21 ENCOUNTER — Telehealth: Payer: Self-pay | Admitting: *Deleted

## 2011-06-21 VITALS — BP 118/84 | HR 74 | Temp 98.3°F | Ht 66.5 in | Wt 190.6 lb

## 2011-06-21 DIAGNOSIS — C50919 Malignant neoplasm of unspecified site of unspecified female breast: Secondary | ICD-10-CM

## 2011-06-21 NOTE — Telephone Encounter (Signed)
gave patient appointment for 09-2011 printed out calendar and gave to the patient 

## 2011-06-22 ENCOUNTER — Encounter: Payer: Self-pay | Admitting: Family

## 2011-06-22 NOTE — Progress Notes (Signed)
OFFICE PROGRESS NOTE  CC Dr. Lurline Hare Dr. Harriette Bouillon  Heather Schaefer,Heather K., MD, MD [address] 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  PRIOR THERAPY: 1. Left breast lumpectomy with sentinel node biopsy with final pathology revealing high-grade invasive ductal carcinoma measuring 1.1 cm with associated high-grade ductal carcinoma in situ. 4 sentinel nodes negative. 2. 4 cycles of dose dense Adriamycin Cytoxan 3. Received 9 weeks of single agent Taxol, last treatment 02/26/2011. Total of 12 weeks of Taxol were planned but due to significant grade 3 peripheral neuropathy, treatment was stopped 4, Radiation therapy to the left breast completed 05/07/11  CURRENT THERAPY: Tamoxifen 20 mg po daily starting 05/18/11  INTERVAL HISTORY: Considerable complaints with myalgias and arthralgias. Insomnia has improved, takes Tamoxifen at night. Continues to have peripheral neuropathy, bilateral lower extremities, on Neurontin 300 mg three times daily. She would like to try cutting back to bid dosing. Hopes to eventually be able to discontinue. Takes a Vitamin B complex.   Is happy to report a 11 lb weight loss on Weight Watchers. Denies fevers, chills, night sweats, headaches, shortness of breath, chest pains, or palpitations. Has bilateral SI joint pain, Remains amenorrheic, having hot flashes. Remainder of the 10 point review of systems is negative.  MEDICAL HISTORY: Past Medical History  Diagnosis Date  . Breast cancer   . Glaucoma   . Breast cancer, female 02/23/2011  . Nasal congestion   . Cough   . No pertinent past medical history     ALLERGIES:  is allergic to hydrocodone.  MEDICATIONS:  Current Outpatient Prescriptions  Medication Sig Dispense Refill  . b complex vitamins capsule Take 1 capsule by mouth daily.        . cholecalciferol (VITAMIN D) 1000 UNITS  tablet Take 1,000 Units by mouth daily.        Marland Kitchen dextromethorphan-guaiFENesin (MUCINEX DM) 30-600 MG per 12 hr tablet Take 1 tablet by mouth every 12 (twelve) hours.        . folic acid (FOLVITE) 1 MG tablet Take 1 mg by mouth daily.        Marland Kitchen gabapentin (NEURONTIN) 300 MG capsule Take 1 capsule (300 mg total) by mouth 3 (three) times daily.  30 capsule  12  . ibuprofen (ADVIL,MOTRIN) 200 MG tablet Take 200 mg by mouth every 6 (six) hours as needed.        . loratadine (CLARITIN) 10 MG tablet Take 10 mg by mouth daily.        Marland Kitchen LORazepam (ATIVAN) 0.5 MG tablet TAKE ONE TO TWO TABLETS BY MOUTH EVERY 6 TO 8 HOURS AS NEEDED FOR NAUSEA AND VOMITING  90 tablet  0  . TRAVATAN Z 0.004 % SOLN ophthalmic solution       . vitamin E 100 UNIT capsule Take by mouth 3 (three) times daily.          SURGICAL HISTORY:  Past Surgical History  Procedure Date  . Breast surgery   . Laparoscopic endometriosis fulguration 2001  . Port-a-cath removal 05/29/2011    Procedure: MINOR REMOVAL PORT-A-CATH;  Surgeon: Clovis Pu. Cornett, MD;  Location: [facility];  Service: General;;    REVIEW OF SYSTEMS:  Pertinent items are noted in HPI.   SELF-ASSESSMENT CONCERNS: Physical: Insomnia, lack of exercise. Numbness or tingling in feet.  Spiritual/Religious:None Practical: None Family/Relationship: None Emotional: Fear of cancer coming back.  Lifestyle or Information  Needs: Exercise and activity, diet and nutrition.   PHYSICAL EXAMINATION: General appearance: alert, cooperative and appears stated age. Accompanied by her husband.  Head: Normocephalic, without obvious abnormality, atraumatic Neck: no adenopathy, no carotid bruit, no JVD, supple, symmetrical, trachea midline and thyroid not enlarged, symmetric, no tenderness/mass/nodules Lymph nodes: Cervical, supraclavicular, and axillary nodes normal. Resp: clear to auscultation bilaterally and normal percussion bilaterally Back: symmetric, no curvature.  ROM normal. No CVA tenderness. Cardio: regular rate and rhythm, S1, S2 normal, no murmur, click, rub or gallop and normal apical impulse GI: soft, non-tender; bowel sounds normal; no masses,  no organomegaly Extremities: extremities normal, atraumatic, no cyanosis or edema Neurologic: Alert and oriented X 3, normal strength and tone. Normal symmetric reflexes. Normal coordination and gait BREAST EXAM: In the supine position, with the right arm over the head, the right nipple is everted. No periareolar edema or nipple discharge. No mass in any quadrant or subareolar region. No redness of the skin. No right axillary adenopathy. With the left arm over the head, the left nipple is everted. No periareolar edema or nipple discharge. No mass in any quadrant or subareolar region. 9 o'clock position, a well-healed lumpectomy scar with volume deficit underlying the scar. No redness of the skin. No left axillary adenopathy. Port a cath has been removed. Incision is well-healed.    ECOG PERFORMANCE STATUS: 0 - Asymptomatic  Blood pressure 118/84, pulse 74, temperature 98.3 F (36.8 C), height 5' 6.5" (1.689 m), weight 190 lb 9.6 oz (86.456 kg), last menstrual period 08/28/2010.  ASSESSMENT: 46 year old female with:  1. Left breast cancer, no evidence of recurrence.  2. Completed chemotherapy with abbreviated course of Taxol with good tolerance.  3. Peripheral neuropathy on Neurontin.  4. Weight loss on Weight Watchers.  5. On Tamoxifen with good tolerance.   PLAN: 1. Keep regularly scheduled follow-up with Dr. Welton Flakes. She and I will alternate visits.  2. Remain on Tamoxifen.  3. Mammogram will be July. This will be 6 months after the completion of radiation therapy.  4. Continue Weight Watchers. We set a goal of a 5 lb additional weight loss by her next visit.     DISCUSSION: A total of 45 was spent with the patient. More than 30 minutes were spent on counseling regarding cancer survivor issues and  coordination of follow-up care. NCCN guidelines for follow-up care were reviewed and a surveillance plan was outlined. The importance of compliance was stressed. All questions were answered. The patient knows to call the clinic with any problems, questions or concerns. We can certainly see the patient much sooner if necessary.

## 2011-07-18 ENCOUNTER — Other Ambulatory Visit: Payer: Self-pay | Admitting: Physician Assistant

## 2011-07-18 DIAGNOSIS — C50319 Malignant neoplasm of lower-inner quadrant of unspecified female breast: Secondary | ICD-10-CM

## 2011-07-19 ENCOUNTER — Telehealth: Payer: Self-pay | Admitting: *Deleted

## 2011-07-19 MED ORDER — CEPHALEXIN 500 MG PO CAPS: 500.0000 mg | ORAL_CAPSULE | Freq: Three times a day (TID) | ORAL | Status: AC

## 2011-07-19 NOTE — Telephone Encounter (Signed)
Pt called lmovm with following concerns:  "1 they have my mammogram set up for June, I finished Radiation 05/07/11. Is this okay? 2 I need a refill on my Ativan. 3. I have an ingrown hair on upper thigh. I have had this before and they gave me an antibiotic. Can you give me that again?  Spoke to [place], [place] from triage who advised pt's ativan was called in on 06/20/11. Rx should be filled. Called [facility] in [address]- verified Rx has been filled."  Reviewed with MD- VO for Keflex 500mg  TID  x 10 day.  Medication called into [facility] [place] per pt request  Called pt advised new rx

## 2011-07-20 ENCOUNTER — Telehealth: Payer: Self-pay | Admitting: *Deleted

## 2011-07-20 NOTE — Telephone Encounter (Signed)
Called pt- per MD " ok to have mammogram in July instead of June as it may slightly painful as it will be exactly at the 64-month mark since last rad treatment." Unable to reach pt. LMOVM with above information. Pt to call back with concerns.

## 2011-08-30 ENCOUNTER — Ambulatory Visit: Payer: BC Managed Care – PPO | Admitting: Radiation Oncology

## 2011-09-18 ENCOUNTER — Other Ambulatory Visit: Payer: Self-pay | Admitting: Physician Assistant

## 2011-09-18 DIAGNOSIS — C50319 Malignant neoplasm of lower-inner quadrant of unspecified female breast: Secondary | ICD-10-CM

## 2011-09-19 NOTE — Progress Notes (Signed)
Encounter addended by: Lowella Petties, RN on: 09/19/2011  2:25 PM<BR>     Documentation filed: Charges VN

## 2011-10-09 ENCOUNTER — Ambulatory Visit
Admission: RE | Admit: 2011-10-09 | Discharge: 2011-10-09 | Disposition: A | Discharge status: Home / Self Care | Payer: BC Managed Care – PPO | Source: Ambulatory Visit | Attending: Oncology | Admitting: Oncology

## 2011-10-09 DIAGNOSIS — Z853 Personal history of malignant neoplasm of breast: Secondary | ICD-10-CM

## 2011-10-09 DIAGNOSIS — Z9889 Other specified postprocedural states: Secondary | ICD-10-CM

## 2011-10-11 ENCOUNTER — Encounter: Payer: Self-pay | Admitting: Oncology

## 2011-10-11 ENCOUNTER — Ambulatory Visit (HOSPITAL_BASED_OUTPATIENT_CLINIC_OR_DEPARTMENT_OTHER): Payer: BC Managed Care – PPO | Admitting: Oncology

## 2011-10-11 ENCOUNTER — Telehealth: Payer: Self-pay | Admitting: *Deleted

## 2011-10-11 ENCOUNTER — Other Ambulatory Visit (HOSPITAL_BASED_OUTPATIENT_CLINIC_OR_DEPARTMENT_OTHER): Payer: BC Managed Care – PPO | Admitting: Lab

## 2011-10-11 VITALS — BP 124/84 | HR 76 | Temp 98.7°F | Ht 66.5 in | Wt 191.2 lb

## 2011-10-11 DIAGNOSIS — C50919 Malignant neoplasm of unspecified site of unspecified female breast: Secondary | ICD-10-CM

## 2011-10-11 DIAGNOSIS — C50319 Malignant neoplasm of lower-inner quadrant of unspecified female breast: Secondary | ICD-10-CM

## 2011-10-11 DIAGNOSIS — Z170 Estrogen receptor positive status [ER+]: Secondary | ICD-10-CM

## 2011-10-11 LAB — CBC & DIFF AND RETIC
Eosinophils Absolute: 0.2 10*3/uL (ref 0.0–0.5)
HCT: 39 % (ref 34.8–46.6)
Immature Retic Fract: 10.2 % — ABNORMAL HIGH (ref 1.60–10.00)
LYMPH%: 34.6 % (ref 14.0–49.7)
MCV: 89.9 fL (ref 79.5–101.0)
MONO#: 0.3 10*3/uL (ref 0.1–0.9)
MONO%: 5.3 % (ref 0.0–14.0)
NEUT#: 3.3 10*3/uL (ref 1.5–6.5)
NEUT%: 56.4 % (ref 38.4–76.8)
Platelets: 280 10*3/uL (ref 145–400)
RBC: 4.34 10*6/uL (ref 3.70–5.45)
WBC: 5.9 10*3/uL (ref 3.9–10.3)
nRBC: 0 % (ref 0–0)

## 2011-10-11 NOTE — Progress Notes (Signed)
OFFICE PROGRESS NOTE  CC Dr. Lurline Hare Dr. Harriette Bouillon [name],[name], MD [address] [place] [address]  DIAGNOSIS: 46 year old female with stage I (TI A. 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  PRIOR THERAPY:  #1 patient underwent a left breast lumpectomy with sentinel node biopsy with the final pathology revealing a high-grade invasive ductal carcinoma measuring 1.1 cm with associated high-grade ductal carcinoma in situ with 4 sentinel nodes were negative for metastatic disease. The tumor was ER positive PR negative HER-2/neu negative with a proliferation marker 97%.  #2 status post 4 cycles of dose dense Adriamycin Cytoxan  #3 status post 9 weeks of single agent Taxol. Her last treatment was given on 02/26/2011. Total of 12 weeks of Taxol were planned but do to significant grade 3 peripheral neuropathy the treatment was stopped earlier  #4 S/P radiation therapy to the left breast completed on 05/07/11  CURRENT THERAPY: Tamoxifen 20 mg po daily starting 05/18/11  INTERVAL HISTORY: Heather Schaefer 46 y.o. female returns for followup visit today. Overall she is doing well she is tolerating the tamoxifen quite nicely with some hot flashes but they're very much tolerable. Patient is also losing weight she is exercising and had it looks remarkably well she denies any headaches double vision blurring of vision no fevers chills night sweats no shortness of breath no cough hemoptysis hematemesis or any other bleeding problems no vaginal bleeding remainder of the 10 point review of systems is negative. MEDICAL HISTORY: Past Medical History  Diagnosis Date  . Breast cancer   . Glaucoma   . Breast cancer, female 02/23/2011  . Nasal congestion   . Cough   . No pertinent past medical history     ALLERGIES:  is allergic to hydrocodone.  MEDICATIONS:  Current Outpatient Prescriptions  Medication  Sig Dispense Refill  . b complex vitamins capsule Take 1 capsule by mouth daily.        . cholecalciferol (VITAMIN D) 1000 UNITS tablet Take 1,000 Units by mouth daily.        . folic acid (FOLVITE) 1 MG tablet Take 1 mg by mouth daily.        Marland Kitchen gabapentin (NEURONTIN) 300 MG capsule Take 1 capsule (300 mg total) by mouth 3 (three) times daily.  30 capsule  12  . ibuprofen (ADVIL,MOTRIN) 200 MG tablet Take 200 mg by mouth every 6 (six) hours as needed.        . loratadine (CLARITIN) 10 MG tablet Take 10 mg by mouth daily.        Marland Kitchen LORazepam (ATIVAN) 0.5 MG tablet TAKE ONE TO TWO TABLETS BY MOUTH EVERY 6 TO 8 HOURS AS NEEDED FOR NAUSEA/VOMITING  90 tablet  0  . magnesium 30 MG tablet Take 30 mg by mouth 2 (two) times daily.      . tamoxifen (NOLVADEX) 10 MG tablet Take 10 mg by mouth daily.       . TRAVATAN Z 0.004 % SOLN ophthalmic solution       . vitamin E 100 UNIT capsule Take by mouth 3 (three) times daily.        Marland Kitchen dextromethorphan-guaiFENesin (MUCINEX DM) 30-600 MG per 12 hr tablet Take 1 tablet by mouth every 12 (twelve) hours.          SURGICAL HISTORY:  Past Surgical History  Procedure Date  . Breast surgery   . Laparoscopic endometriosis fulguration 2001  . Port-a-cath  removal 05/29/2011    Procedure: MINOR REMOVAL PORT-A-CATH;  Surgeon: Clovis Pu. Cornett, MD;  Location: [facility];  Service: General;;    REVIEW OF SYSTEMS:  Pertinent items are noted in HPI.   PHYSICAL EXAMINATION: General appearance: alert, cooperative and appears stated age Head: Normocephalic, without obvious abnormality, atraumatic Neck: no adenopathy, no carotid bruit, no JVD, supple, symmetrical, trachea midline and thyroid not enlarged, symmetric, no tenderness/mass/nodules Lymph nodes: Cervical, supraclavicular, and axillary nodes normal. Resp: clear to auscultation bilaterally and normal percussion bilaterally Back: symmetric, no curvature. ROM normal. No CVA tenderness. Cardio: regular  rate and rhythm, S1, S2 normal, no murmur, click, rub or gallop and normal apical impulse GI: soft, non-tender; bowel sounds normal; no masses,  no organomegaly Extremities: extremities normal, atraumatic, no cyanosis or edema Neurologic: Alert and oriented X 3, normal strength and tone. Normal symmetric reflexes. Normal coordination and gait Bilateral breasts are examined: Right breast no masses or nipple discharge or skin changes. Left breast well-healed surgical scar there is some darkening of the skin with redness do to radiation otherwise no masses or nipple discharge the ECOG PERFORMANCE STATUS: 0 - Asymptomatic  Blood pressure 124/84, pulse 76, temperature 98.7 F (37.1 C), temperature source Oral, height 5' 6.5" (1.689 m), weight 191 lb 3.2 oz (86.728 kg), last menstrual period 08/28/2010.  LABORATORY DATA: Lab Results  Component Value Date   WBC 5.9 10/11/2011   HGB 13.3 10/11/2011   HCT 39.0 10/11/2011   MCV 89.9 10/11/2011   PLT 280 10/11/2011      Chemistry      Component Value Date/Time   NA 140 05/18/2011 1146   K 4.2 05/18/2011 1146   CL 106 05/18/2011 1146   CO2 24 05/18/2011 1146   BUN 12 05/18/2011 1146   CREATININE 0.92 05/18/2011 1146      Component Value Date/Time   CALCIUM 9.8 05/18/2011 1146   ALKPHOS 74 05/18/2011 1146   AST 23 05/18/2011 1146   ALT 23 05/18/2011 1146   BILITOT 0.6 05/18/2011 1146       RADIOGRAPHIC STUDIES:  No results found.  ASSESSMENT: 46 year old female with:  1.  stage I ER positive PR negative HER-2/neu negative invasive ductal carcinoma of the left breast status post lumpectomy with the final pathology revealing a 1.1 cm high-grade invasive ductal carcinoma with associated ductal carcinoma in situ. 4 sentinel nodes were negative for metastatic disease. Patient did have a high proliferation marker of 97%.   2.She is now status post adjuvant chemotherapy consisting of 4 cycles of dose dense a.c. Followed by 9 weeks of single agent weekly  Taxol. Overall she tolerated the treatment relatively well even though we did have to reduce the number of cycles of Taxol she received by 3.went on to complete radiation therapy. She was begun on tamoxifen 20 mg daily and she is tolerating this very well.   PLAN:  1 patient will continue tamoxifen 20 mg on a daily basis she understands the risks and benefits she knows to look for any kind of side effects.  #2 we will plan on seeing her back in 6 months time  she will continue to get mammograms on a yearly basis.  All questions were answered. The patient knows to call the clinic with any problems, questions or concerns. We can certainly see the patient much sooner if necessary.  I spent 25 minutes counseling the patient face to face. The total time spent in the appointment was 30 minutes.  Drue Second, MD Medical/Oncology [facility] [phone] (beeper) [phone] (Office)  10/11/2011, 11:45 AM

## 2011-10-11 NOTE — Telephone Encounter (Signed)
Made patient appointment for 05-14-2012 starting at 11:00am printed out calendar and gave to the patient

## 2011-10-11 NOTE — Patient Instructions (Addendum)
1. You are doing well.  2. Continue tamoxifen 10 mg daily  3. I will see you back in January 2014

## 2011-11-01 ENCOUNTER — Ambulatory Visit: Payer: BC Managed Care – PPO | Admitting: Radiation Oncology

## 2011-11-01 ENCOUNTER — Encounter: Payer: Self-pay | Admitting: Radiation Oncology

## 2011-11-01 ENCOUNTER — Ambulatory Visit
Admission: RE | Admit: 2011-11-01 | Discharge: 2011-11-01 | Disposition: A | Discharge status: Home / Self Care | Payer: BC Managed Care – PPO | Source: Ambulatory Visit | Attending: Radiation Oncology | Admitting: Radiation Oncology

## 2011-11-01 VITALS — BP 125/87 | HR 73 | Temp 98.2°F | Resp 18 | Wt 193.1 lb

## 2011-11-01 DIAGNOSIS — C50319 Malignant neoplasm of lower-inner quadrant of unspecified female breast: Secondary | ICD-10-CM

## 2011-11-01 NOTE — Progress Notes (Signed)
Patient presents to the clinic today unaccompanied for under treat visit with Dr. Michell Heinrich. Patient is alert and oriented to person, place, and time. No distress noted. Steady gait noted. Pleasant affect noted. Patient denies pain at this time. Patient reports occasional brief sharp shooting pains in her left breast but, confirms they are less frequent and intense. Patient reports numbness in left breast continues. Patient reports left rib pain when she presses on it. Porta cath has been removed. Full ROM noted in both upper extremities. Patient denies aching or swelling in left arm. Patient reports her energy level is great. Patient reports that she continues to take Nolvadex as directed. Reported all findings to Dr. Michell Heinrich.

## 2011-11-01 NOTE — Progress Notes (Signed)
Department of Radiation Oncology  Phone:  [phone] Fax:        [phone]   Name: Heather Schaefer   DOB: [birth year]  MRN: [id number]    Date: 11/01/2011  Follow Up Visit Note  Diagnosis: T1aN0 left breast cancer  Interval since last radiation: 6 months  Interval History: Heather Schaefer presents today for routine followup.  She is feeling well and doing well.  She feels like her cancer treatment was "just a dream"  She is looking forward to a reunion with the women she took [facility] with.  She is tolerating her antiestrogen and had negative mammograms in June.  She and her husband are thinking about moving to [place] [place]. She has occasional pain in her left breast. She has pain on her left ribs/chest wall when she lays on that side.  She denies any palpable abnormalities.  Allergies:  Allergies  Allergen Reactions  . Hydrocodone Nausea And Vomiting    Medications:  Current Outpatient Prescriptions  Medication Sig Dispense Refill  . b complex vitamins capsule Take 1 capsule by mouth daily.        . cholecalciferol (VITAMIN D) 1000 UNITS tablet Take 1,000 Units by mouth daily.        . clotrimazole-betamethasone (LOTRISONE) cream       . folic acid (FOLVITE) 1 MG tablet Take 1 mg by mouth daily.        Marland Kitchen gabapentin (NEURONTIN) 300 MG capsule Take 1 capsule (300 mg total) by mouth 3 (three) times daily.  30 capsule  12  . ibuprofen (ADVIL,MOTRIN) 200 MG tablet Take 200 mg by mouth every 6 (six) hours as needed.        . loratadine (CLARITIN) 10 MG tablet Take 10 mg by mouth daily.        Marland Kitchen LORazepam (ATIVAN) 0.5 MG tablet TAKE ONE TO TWO TABLETS BY MOUTH EVERY 6 TO 8 HOURS AS NEEDED FOR NAUSEA/VOMITING  90 tablet  0  . magnesium 30 MG tablet Take 30 mg by mouth 2 (two) times daily.      . tamoxifen (NOLVADEX) 10 MG tablet Take 10 mg by mouth daily.       . TRAVATAN Z 0.004 % SOLN ophthalmic solution       . vitamin E 100 UNIT capsule Take by mouth 3 (three) times daily.        Marland Kitchen  dextromethorphan-guaiFENesin (MUCINEX DM) 30-600 MG per 12 hr tablet Take 1 tablet by mouth every 12 (twelve) hours.          Physical Exam:   weight is 193 lb 1.6 oz (87.59 kg). Her oral temperature is 98.2 F (36.8 C). Her blood pressure is 125/87 and her pulse is 73. Her respiration is 18. Alert and oriented x 3. She has no palpable abnormalities of her right breast.  No palpable axillary adenopathy bilaterally. Her left breast is slightly darker.  She has edema in her nipple-areolar area.  Her scar is present medially and is well healed.    IMPRESSION: Heather Schaefer is a 46 y.o. female who has no evidence of disease at this time and is healing well.  PLAN:  Follow up with Dr. Donnie Coffin as previously scheduled as well as with surgery.  No follow up with me.  We discussed using lotion with vitamin e or seeing a dermatologist for bleaching creams if it starts to bother her that her breast is darker.  I think the pain she is having is normal and we talked  a good deal bout that.  She knows she can always call with any questions or concerns.     Lurline Hare, MD

## 2011-11-15 ENCOUNTER — Other Ambulatory Visit: Payer: Self-pay | Admitting: Oncology

## 2011-11-15 DIAGNOSIS — F419 Anxiety disorder, unspecified: Secondary | ICD-10-CM

## 2011-11-16 ENCOUNTER — Other Ambulatory Visit: Payer: Self-pay | Admitting: Medical Oncology

## 2011-11-16 DIAGNOSIS — C50319 Malignant neoplasm of lower-inner quadrant of unspecified female breast: Secondary | ICD-10-CM

## 2011-11-16 MED ORDER — LORAZEPAM 0.5 MG PO TABS: 0.5000 mg | ORAL_TABLET | Freq: Four times a day (QID) | ORAL | Status: DC | PRN

## 2012-01-15 ENCOUNTER — Other Ambulatory Visit: Payer: Self-pay | Admitting: Medical Oncology

## 2012-01-15 ENCOUNTER — Other Ambulatory Visit: Payer: Self-pay | Admitting: Oncology

## 2012-01-22 IMAGING — CR DG CHEST 1V PORT
1 series · 1 of 1 positions shown · non-contrast
Comparison: 08/24/2010

CLINICAL DATA: Port-A-Cath placement.

PORTABLE CHEST - 1 VIEW

[AP]
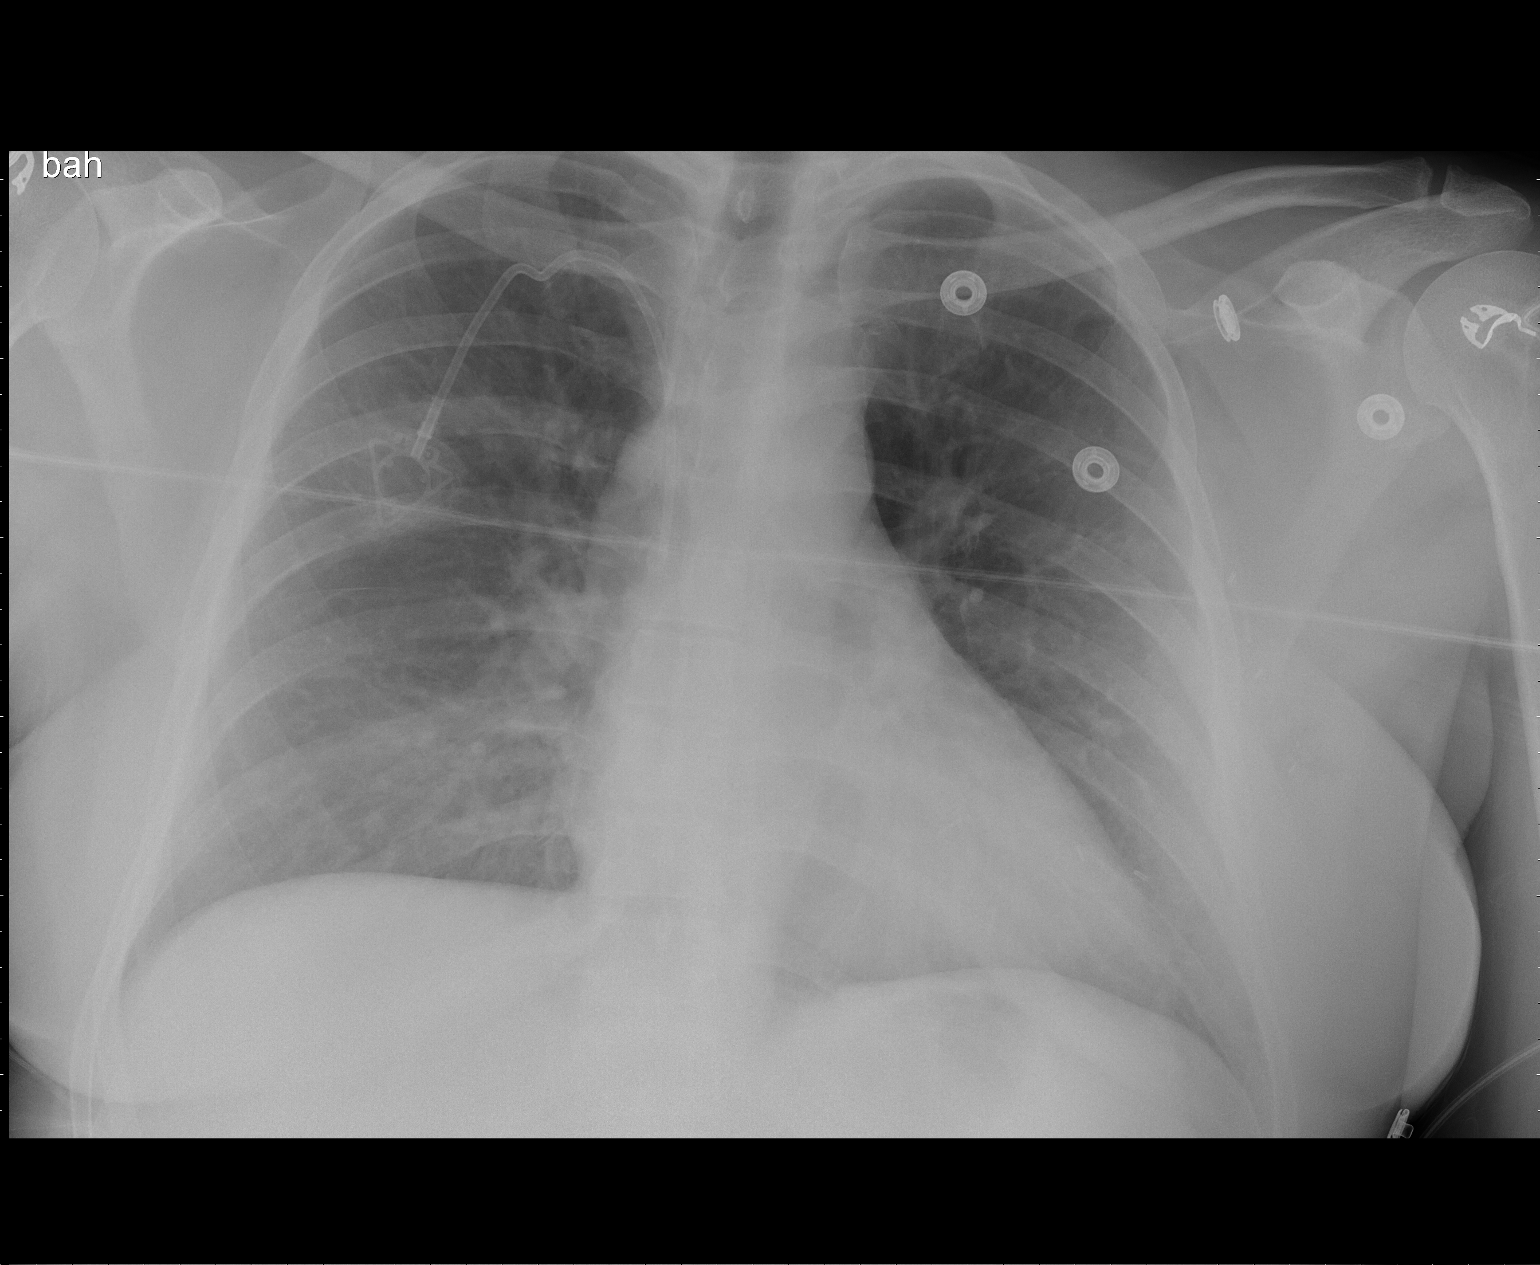

[1 of 1 positions shown; findings below may reference images not displayed]

FINDINGS: Right Port-A-Cath has been placed with the tip in the
SVC.  No pneumothorax.  The catheter is angulated in the region of
the anterior right first rib and clavicle.  Lumen appears patent.

Heart and mediastinal contours are within normal limits.  No focal
opacities or effusions.  No acute bony abnormality.
IMPRESSION: Right Port-A-Cath placement as above with the tip in the SVC.  No
pneumothorax.  Angulation of the catheter as it passes under the
clavicle. There does not appear to be kinking of the catheter which
would obstruct blood flow.

## 2012-01-22 IMAGING — MG MM BREAST SURGICAL SPECIMEN
3 series · 3 of 3 positions shown · non-contrast
Comparison: none

CLINICAL DATA: The patient presents for needle localization prior
to lumpectomy.  Newly diagnosed breast cancer in the eight o'clock
location of the left breast.

[L CC (1 of 2)]
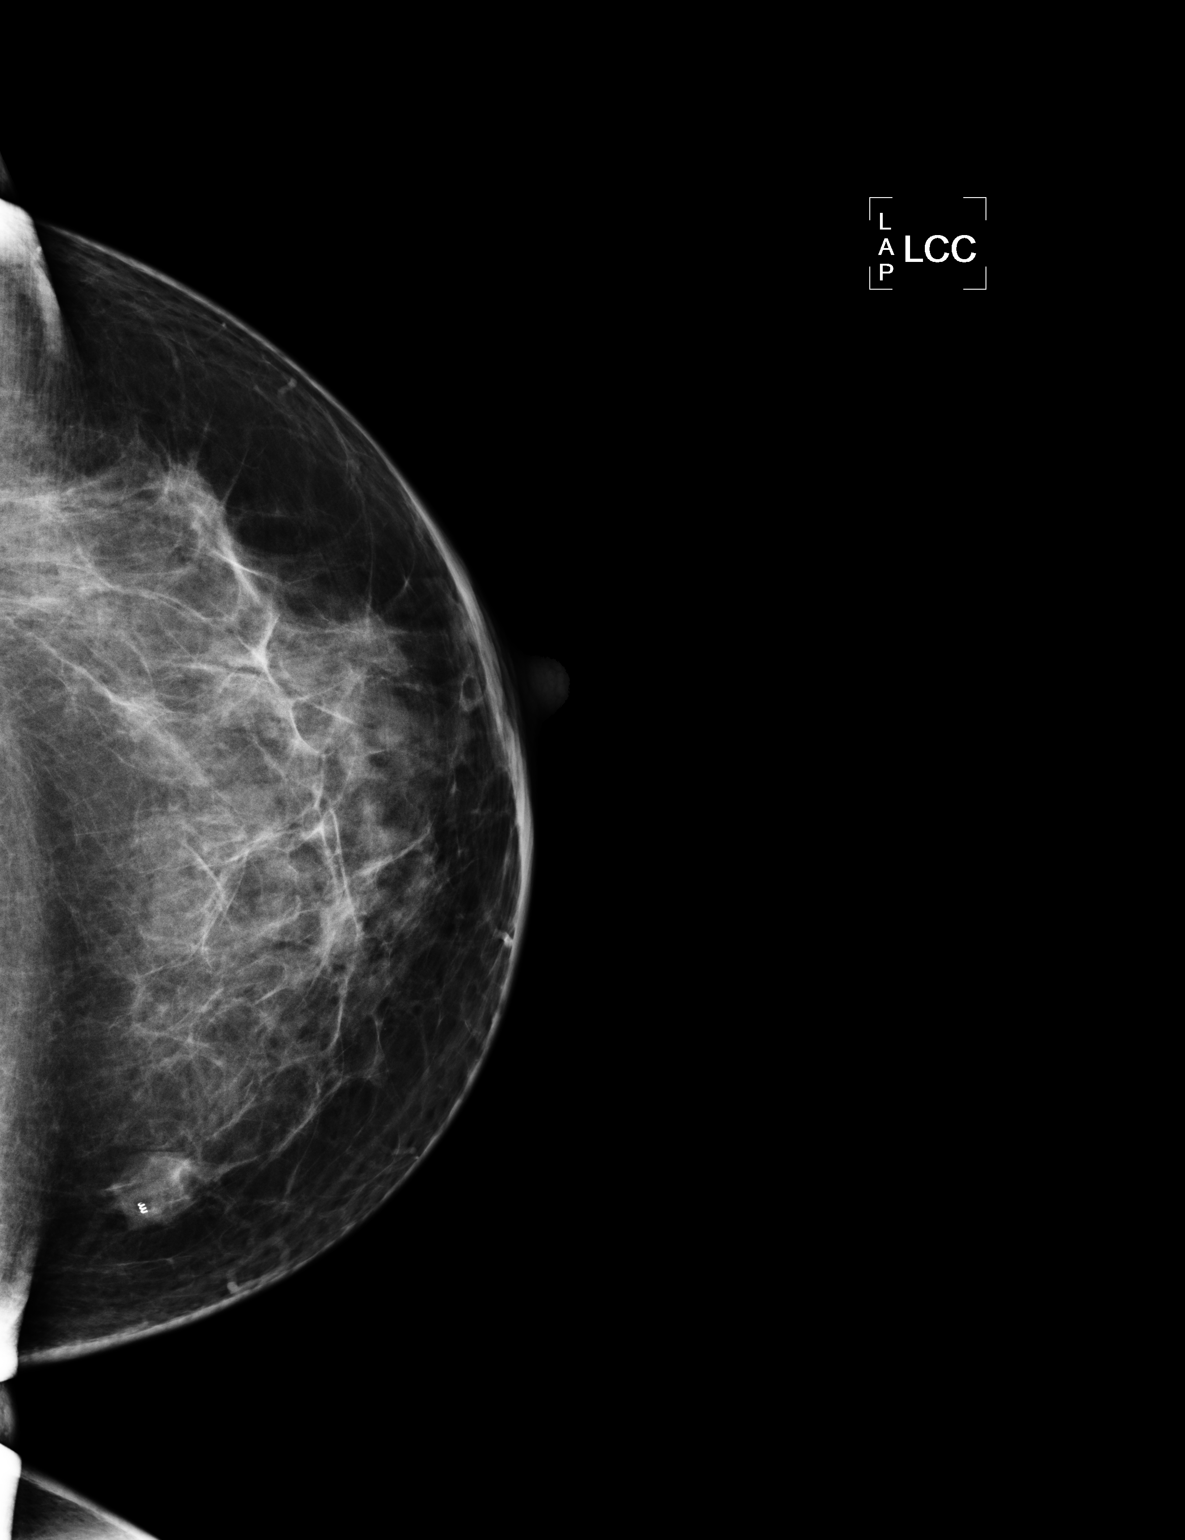

[L ML]
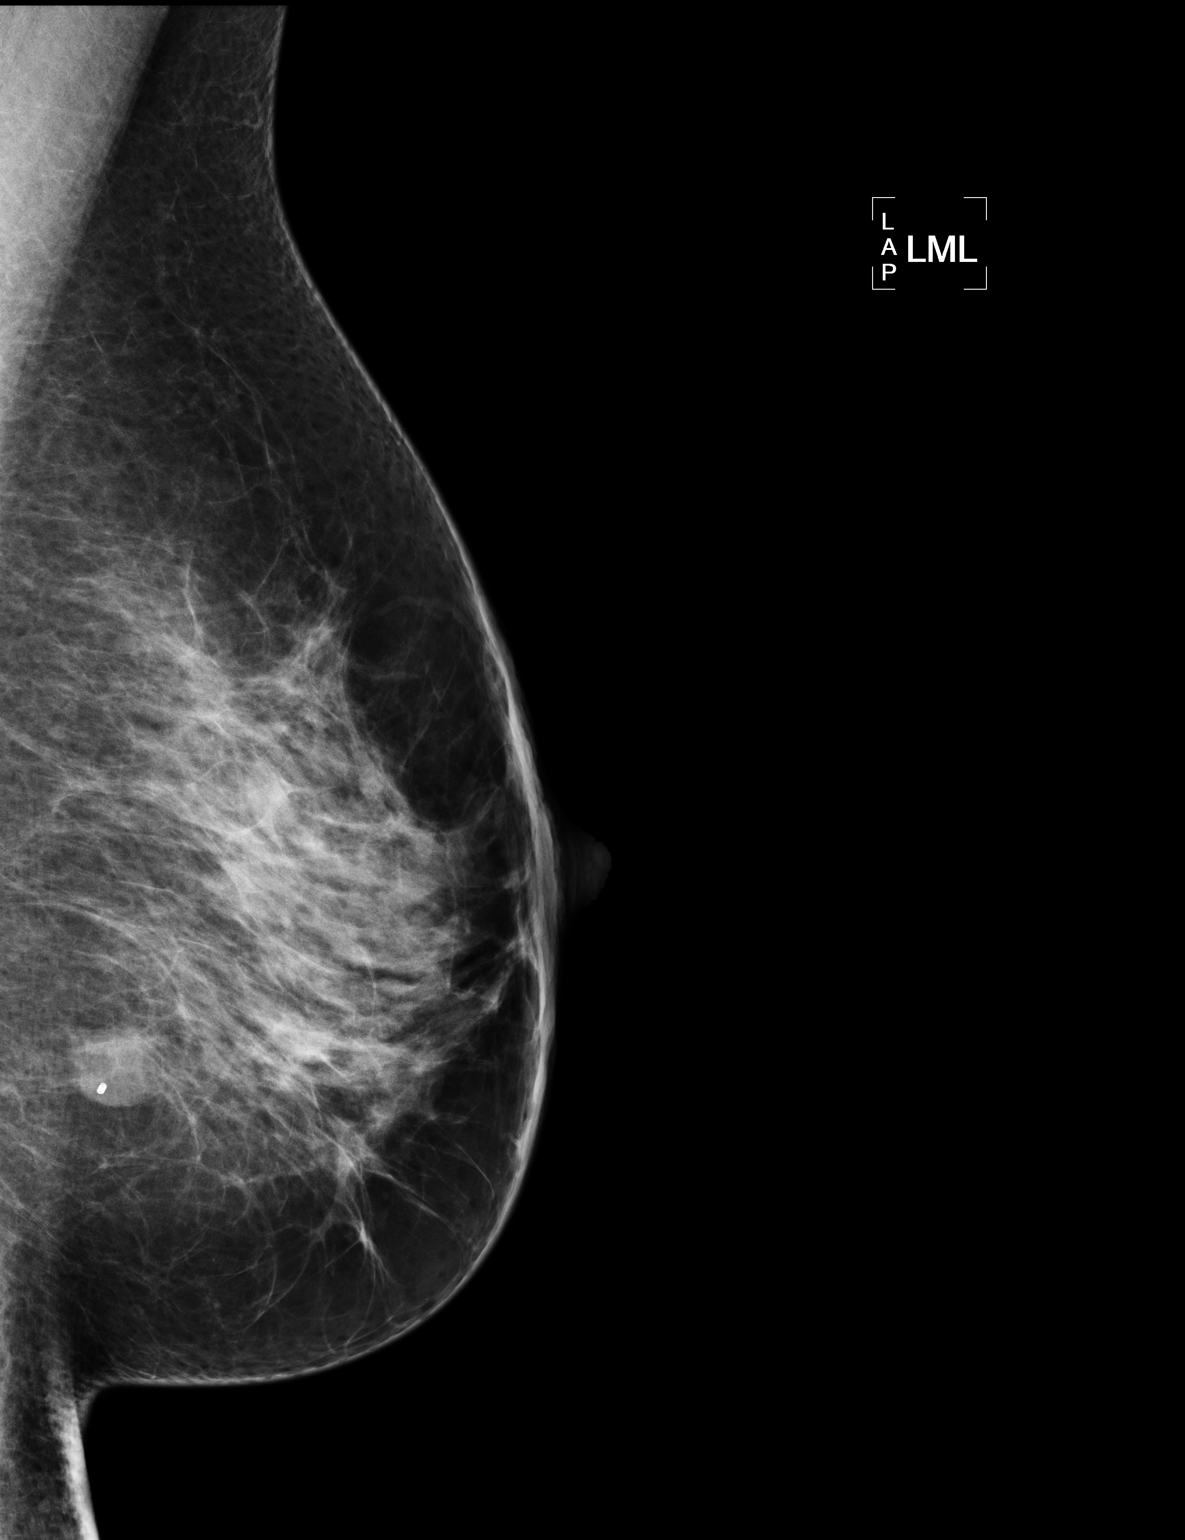

[L CC (2 of 2)]
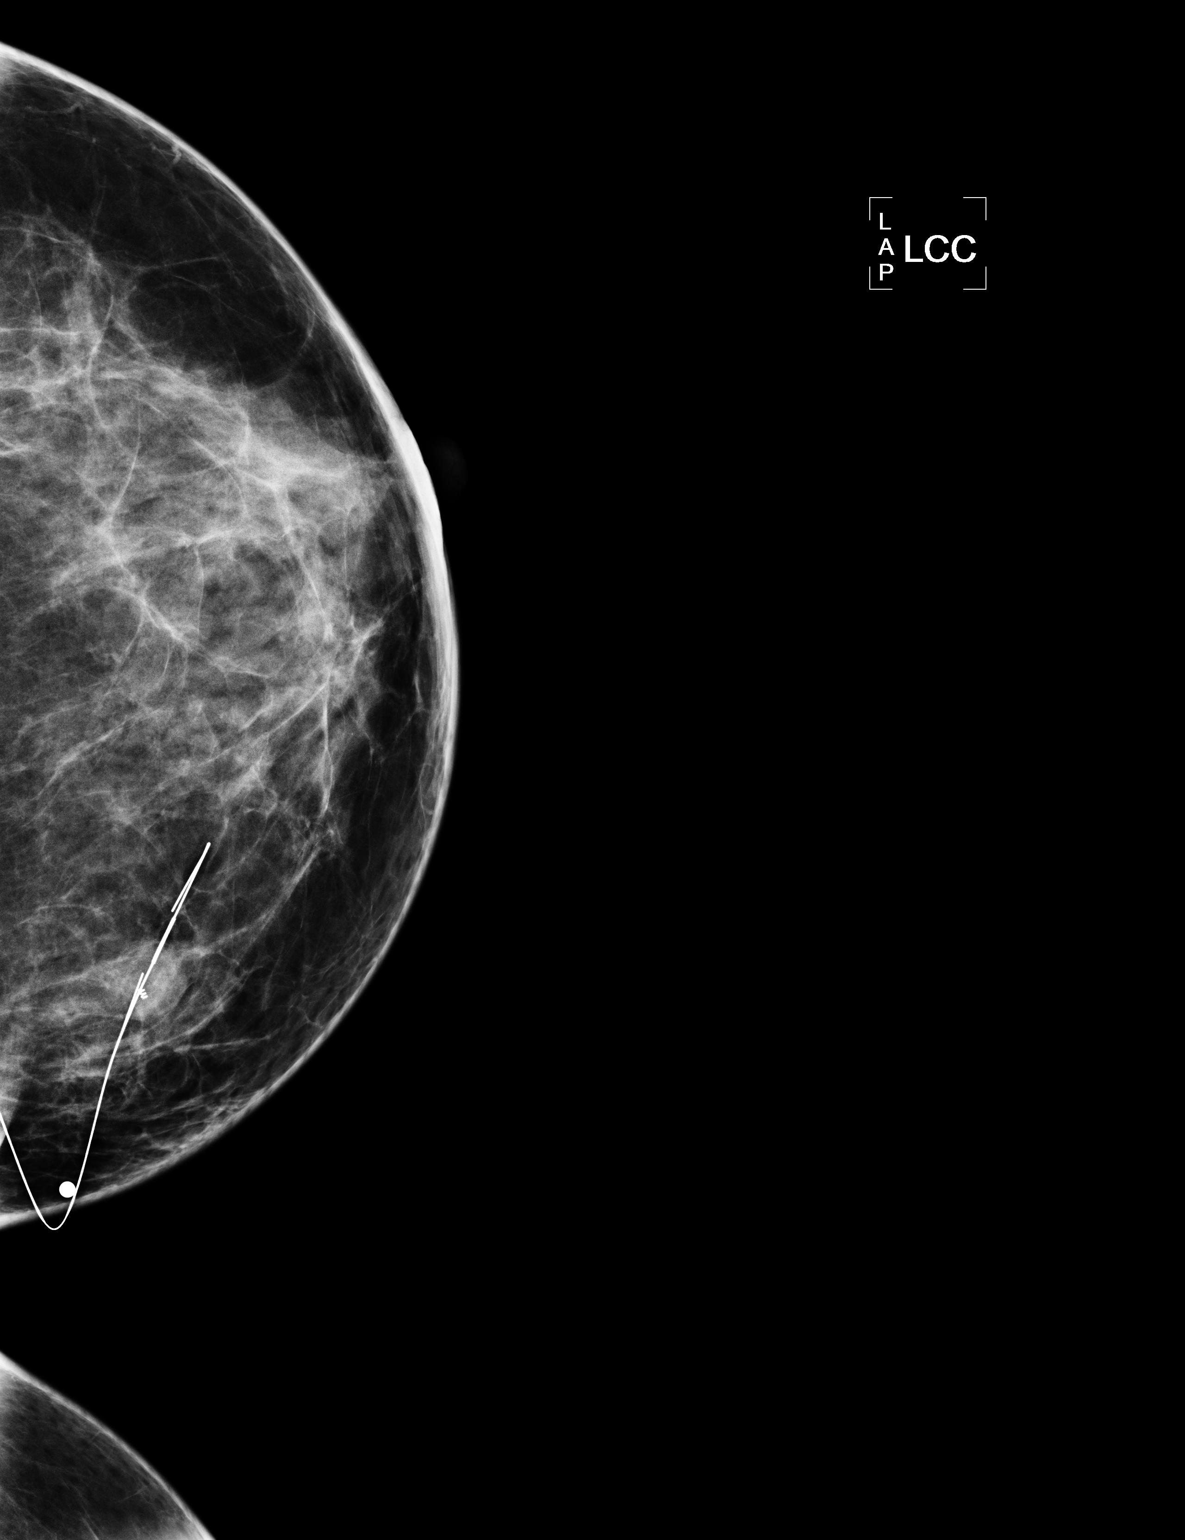

[3 of 3 positions shown; findings below may reference images not displayed]

NEEDLE LOCALIZATION USING ULTRASOUND GUIDANCE AND SPECIMEN
RADIOGRAPH

Patient presents for needle localization prior to lumpectomy.  I
met with the patient and we discussed the procedure of needle
localization including risks, benefits, and alternatives.
Specifically, we discussed the risks of infection, bleeding, tissue
injury, and inadequate sampling.  Informed written consent was
given.

Using ultrasound guidance, sterile technique, 2% lidocaine and a 7
cm needle, mass in the 8 o'clock position of the left breast is
localized using a medial approach.  The films are marked for Dr.
Padatsu.

Specimen radiograph is performed at [HOSPITAL] operating room, and
confirms mass and localizing wire to be present in the tissue
sample.  The specimen is marked for pathology.
IMPRESSION: Needle localization of the left breast.  No apparent complications.

## 2012-02-20 ENCOUNTER — Encounter (INDEPENDENT_AMBULATORY_CARE_PROVIDER_SITE_OTHER): Payer: Self-pay | Admitting: Surgery

## 2012-03-18 ENCOUNTER — Other Ambulatory Visit: Payer: Self-pay | Admitting: Oncology

## 2012-03-19 ENCOUNTER — Other Ambulatory Visit: Payer: Self-pay | Admitting: *Deleted

## 2012-03-19 MED ORDER — LORAZEPAM 0.5 MG PO TABS: 0.5000 mg | ORAL_TABLET | Freq: Four times a day (QID) | ORAL | Status: DC | PRN

## 2012-03-19 NOTE — Telephone Encounter (Signed)
Per MD ok to refill pt's ativan. Called pt confirmed Pharmacy. Rx sent.

## 2012-03-28 ENCOUNTER — Ambulatory Visit (INDEPENDENT_AMBULATORY_CARE_PROVIDER_SITE_OTHER): Payer: BC Managed Care – PPO | Admitting: Surgery

## 2012-04-08 IMAGING — CR DG CHEST 2V
2 series · 2 of 2 positions shown · non-contrast
Comparison: 08/29/2010

***ADDENDUM*** CREATED: 11/14/2010 [DATE]

Distal tip of the right chest port is located in the lower SVC,
unchanged.
***END ADDENDUM*** SIGNED BY: Kano Dones, M.D.
CLINICAL DATA: Port-A-Cath positioning.
CHEST - 2 VIEW

[w chest pa]
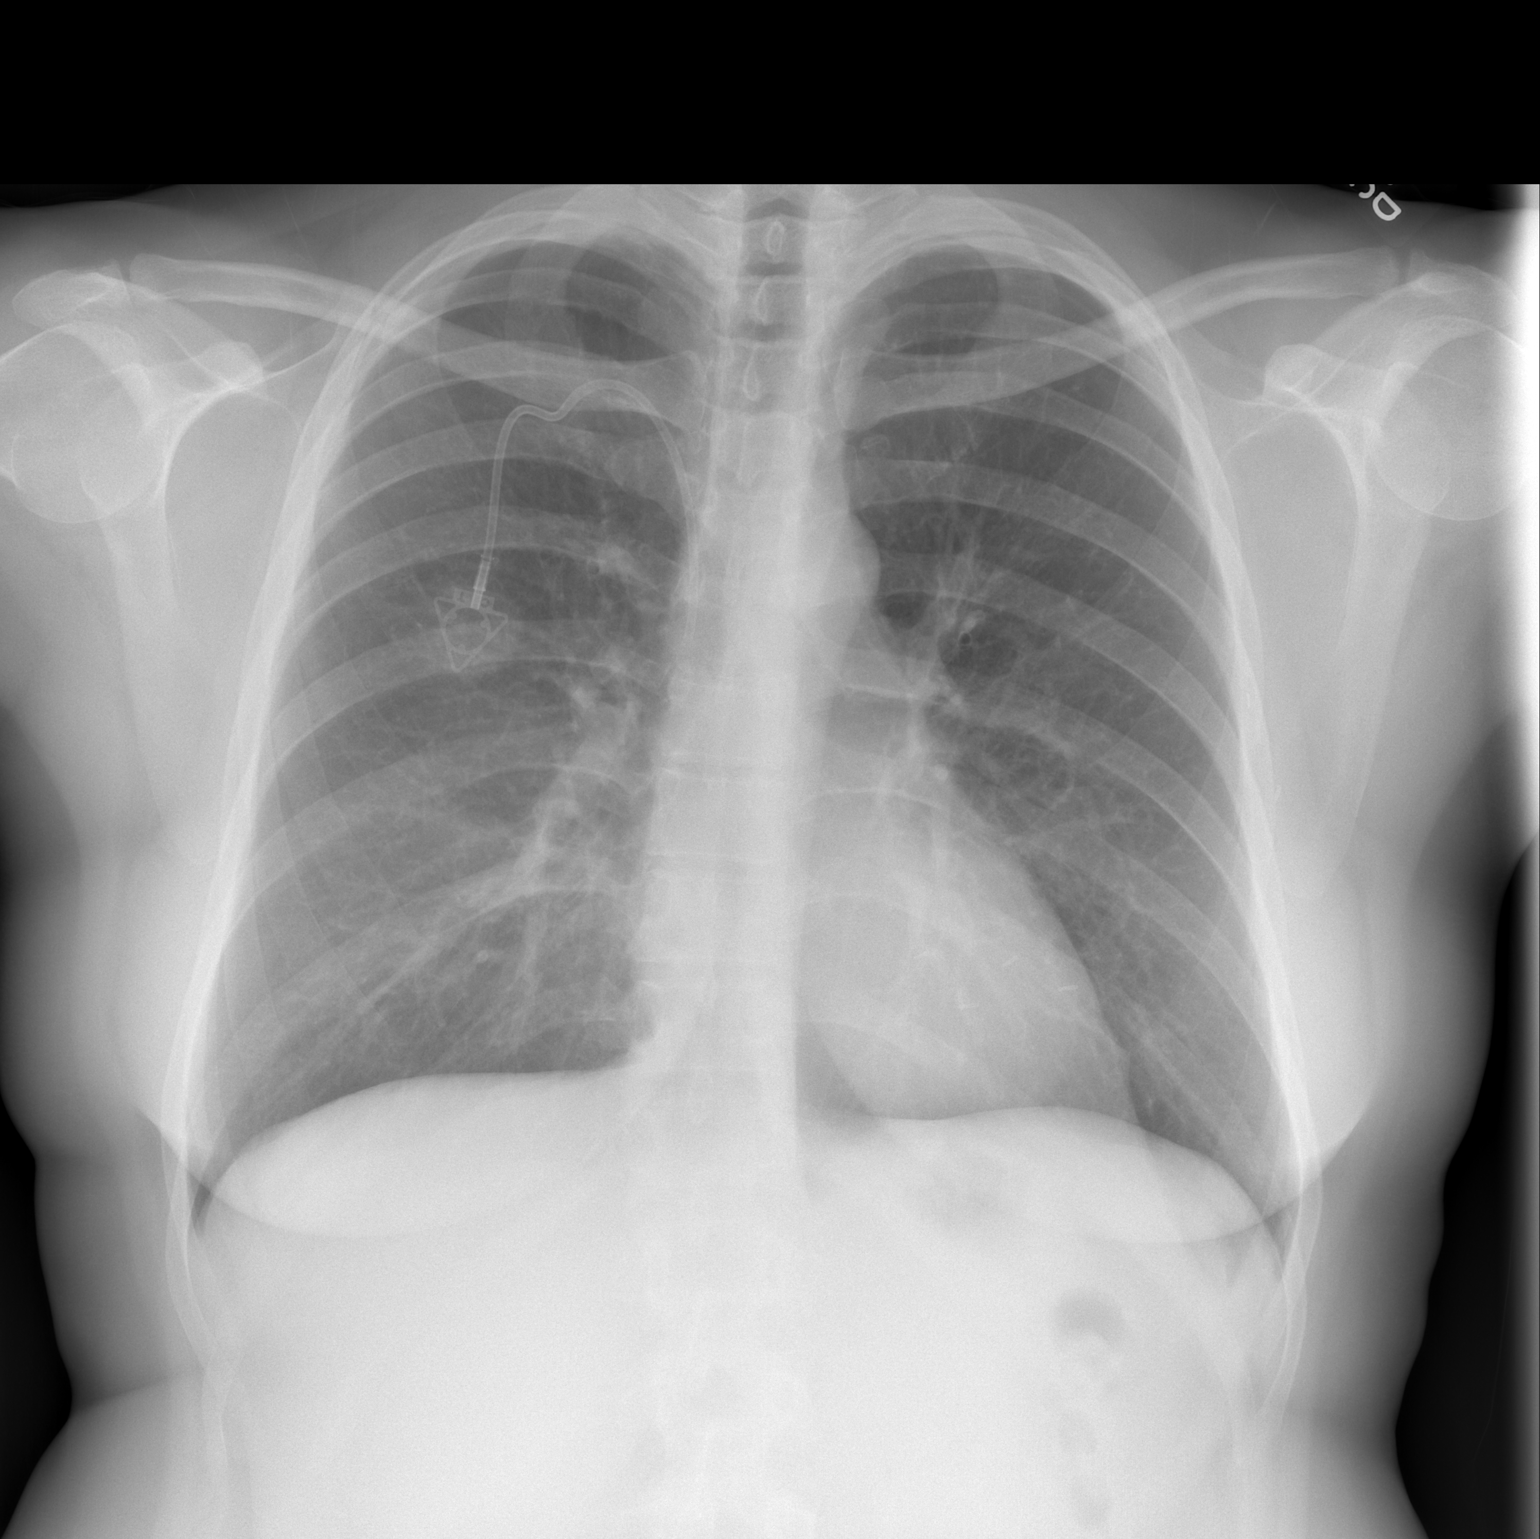

[w chest lat]
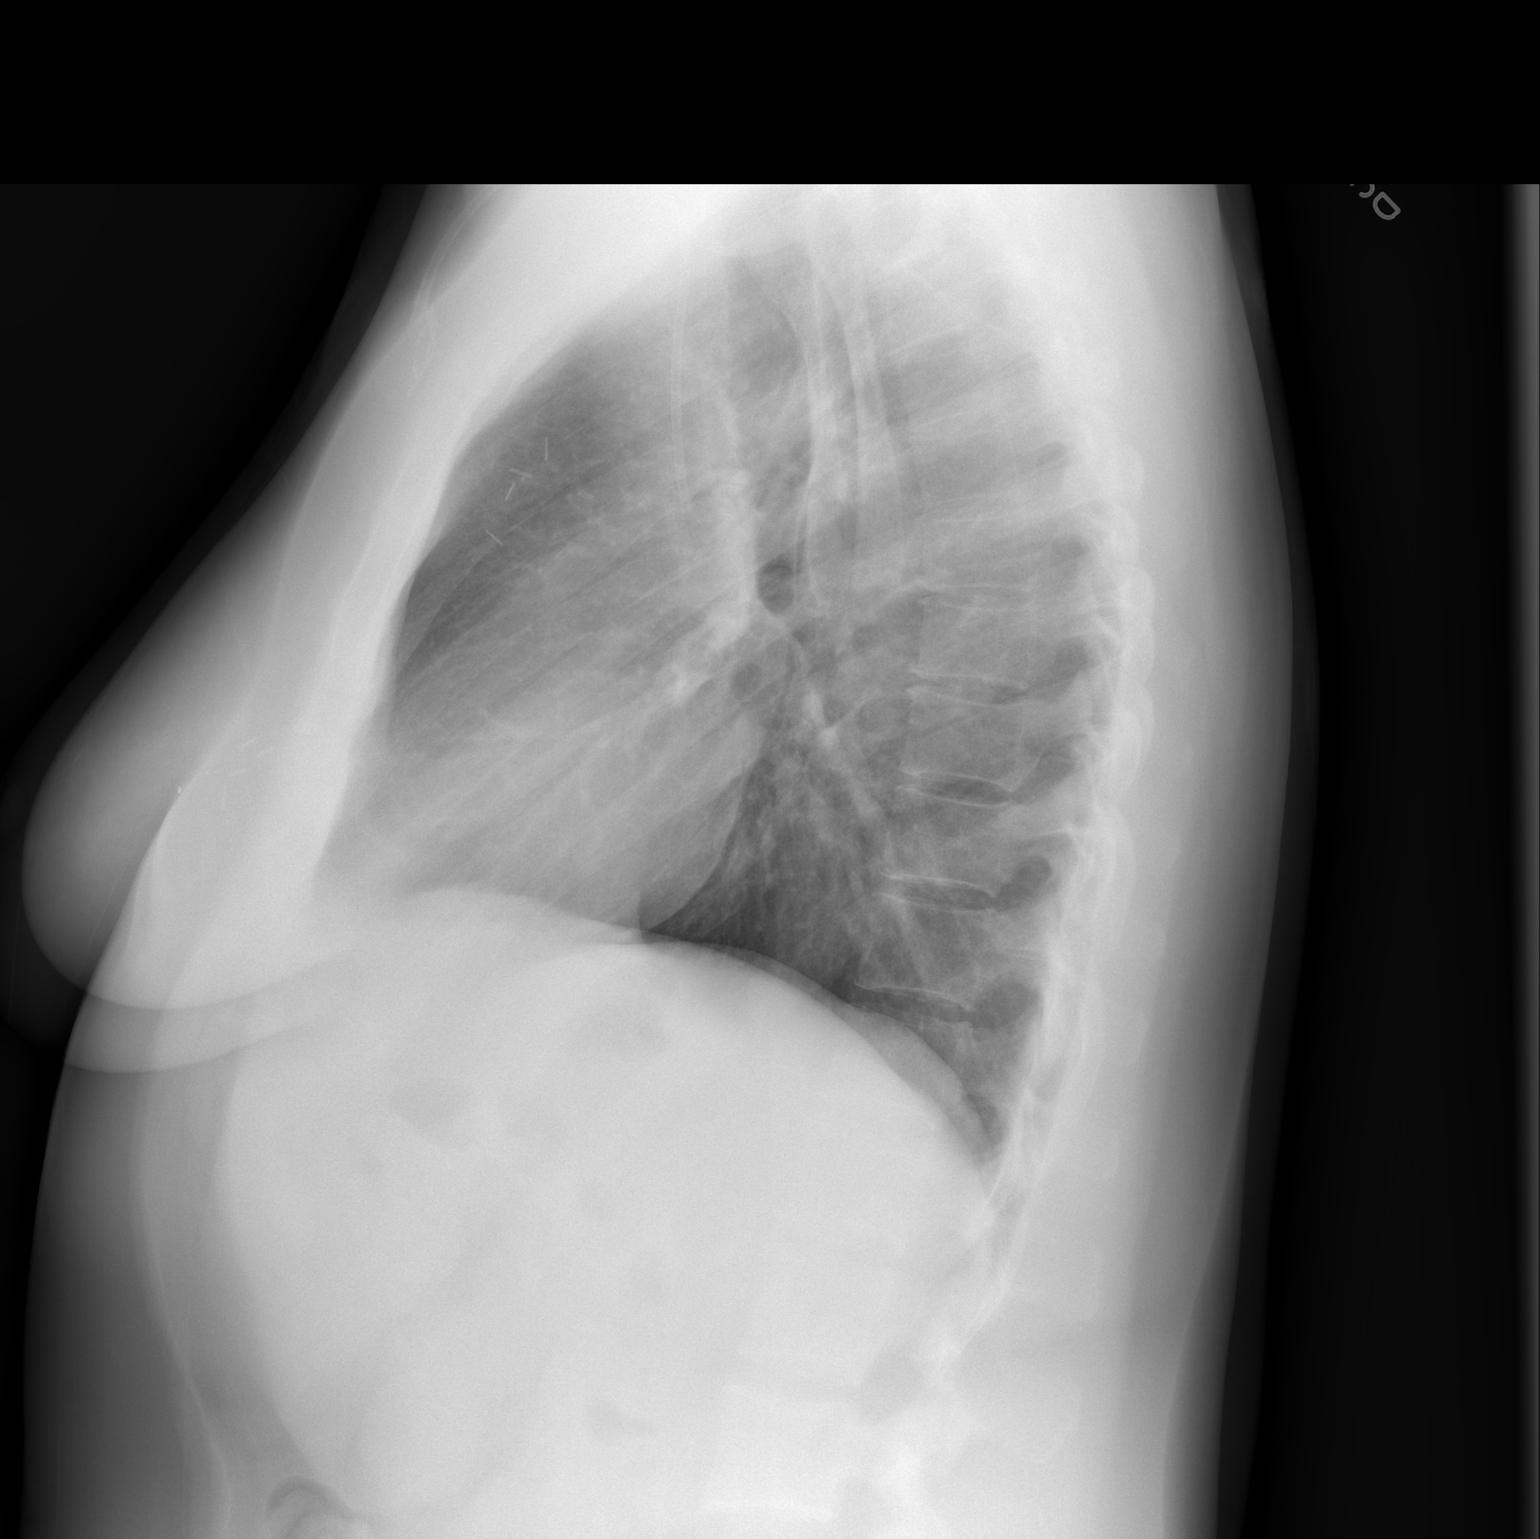

[2 of 2 positions shown; findings below may reference images not displayed]

FINDINGS: Port-A-Cath orientation and positioning appears
relatively similar to the prior exam.  There continues to be some
angulation of the catheter is at passes below the clavicle, but
without the coaptation of the catheter walls visible.

Left axillary clips noted.

Cardiac and mediastinal contours appear normal.

The lungs appear clear.

No pleural effusion is identified.
IMPRESSION: 1.  Stable appearance of the catheter, with some angulation of the
tubing below the right clavicle but without overt kinking of the
catheter.

## 2012-04-25 ENCOUNTER — Encounter (INDEPENDENT_AMBULATORY_CARE_PROVIDER_SITE_OTHER): Payer: Self-pay | Admitting: Surgery

## 2012-04-25 ENCOUNTER — Ambulatory Visit (INDEPENDENT_AMBULATORY_CARE_PROVIDER_SITE_OTHER): Payer: BC Managed Care – PPO | Admitting: Surgery

## 2012-04-25 ENCOUNTER — Other Ambulatory Visit: Payer: Self-pay | Admitting: Oncology

## 2012-04-25 VITALS — BP 124/77 | HR 72 | Temp 97.7°F | Resp 14 | Ht 66.5 in | Wt 202.0 lb

## 2012-04-25 DIAGNOSIS — Z853 Personal history of malignant neoplasm of breast: Secondary | ICD-10-CM

## 2012-04-25 NOTE — Progress Notes (Signed)
Patient ID: Heather Schaefer, female   DOB: [birth year], 47 y.o.   MRN: [id number]  Chief Complaint  Patient presents with  . Breast Cancer Long Term Follow Up    HPI Heather Schaefer is a 47 y.o. female.   HPI The patient returns and long-term followup for and a history of left breast cancer diagnosed in 2012 treated with lumpectomy, sentinel lymph node mapping and postop radiation therapy with chemotherapy. She is doing better with her neuropathy symptoms.  Past Medical History  Diagnosis Date  . Breast cancer   . Glaucoma   . Breast cancer, female 02/23/2011  . Nasal congestion   . Cough   . No pertinent past medical history     Past Surgical History  Procedure Date  . Breast surgery   . Laparoscopic endometriosis fulguration 2001  . Port-a-cath removal 05/29/2011    Procedure: MINOR REMOVAL PORT-A-CATH;  Surgeon: Clovis Pu. Briann Sarchet, MD;  Location: [facility];  Service: General;;    History reviewed. No pertinent family history.  Social History History  Substance Use Topics  . Smoking status: Former Smoker    Quit date: 05/27/2001  . Smokeless tobacco: Not on file  . Alcohol Use: Yes     Comment: 1-2 week    Allergies  Allergen Reactions  . Hydrocodone Nausea And Vomiting    Current Outpatient Prescriptions  Medication Sig Dispense Refill  . b complex vitamins capsule Take 1 capsule by mouth daily.        . cholecalciferol (VITAMIN D) 1000 UNITS tablet Take 1,000 Units by mouth daily.        . clotrimazole-betamethasone (LOTRISONE) cream       . dextromethorphan-guaiFENesin (MUCINEX DM) 30-600 MG per 12 hr tablet Take 1 tablet by mouth every 12 (twelve) hours.        . folic acid (FOLVITE) 1 MG tablet Take 1 mg by mouth daily.        Marland Kitchen gabapentin (NEURONTIN) 300 MG capsule Take 1 capsule (300 mg total) by mouth at bedtime.  30 capsule  5  . ibuprofen (ADVIL,MOTRIN) 200 MG tablet Take 200 mg by mouth every 6 (six) hours as needed.        . loratadine  (CLARITIN) 10 MG tablet Take 10 mg by mouth daily.        Marland Kitchen LORazepam (ATIVAN) 0.5 MG tablet Take 1 tablet (0.5 mg total) by mouth every 6 (six) hours as needed for anxiety.  90 tablet  0  . magnesium 30 MG tablet Take 30 mg by mouth 2 (two) times daily.      . tamoxifen (NOLVADEX) 10 MG tablet Take 10 mg by mouth daily.       . TRAVATAN Z 0.004 % SOLN ophthalmic solution       . vitamin E 100 UNIT capsule Take by mouth 3 (three) times daily.          Review of Systems Review of Systems  Constitutional: Negative for fever, chills and unexpected weight change.  HENT: Negative for hearing loss, congestion, sore throat, trouble swallowing and voice change.   Eyes: Negative for visual disturbance.  Respiratory: Negative for cough and wheezing.   Cardiovascular: Negative for chest pain, palpitations and leg swelling.  Gastrointestinal: Negative for nausea, vomiting, abdominal pain, diarrhea, constipation, blood in stool, abdominal distention and anal bleeding.  Genitourinary: Negative for hematuria, vaginal bleeding and difficulty urinating.  Musculoskeletal: Negative for arthralgias.  Skin: Negative for rash and wound.  Neurological: Negative for seizures, syncope and headaches.  Hematological: Negative for adenopathy. Does not bruise/bleed easily.  Psychiatric/Behavioral: Negative for confusion.    Blood pressure 124/77, pulse 72, temperature 97.7 F (36.5 C), temperature source Temporal, resp. rate 14, height 5' 6.5" (1.689 m), weight 202 lb (91.627 kg), last menstrual period 08/28/2010.  Physical Exam Physical Exam  Constitutional: She is oriented to person, place, and time. She appears well-developed and well-nourished.  HENT:  Head: Normocephalic and atraumatic.  Eyes: EOM are normal. Pupils are equal, round, and reactive to light.  Neck: Normal range of motion. Neck supple.  Pulmonary/Chest:       Left breast shows postsurgical changes without mass. Good cosmetic result. Right  chest is Port-A-Cath in place.  Musculoskeletal: Normal range of motion.  Neurological: She is alert and oriented to person, place, and time.  Skin: Skin is warm and dry.  Psychiatric: She has a normal mood and affect. Her behavior is normal. Judgment and thought content normal.    Data Reviewed Clinical Data: The patient underwent left lumpectomy, chemotherapy  and radiation therapy for breast cancer in 2012.  DIGITAL DIAGNOSTIC BILATERAL MAMMOGRAM WITH CAD  Comparison: 06/06/2010, 06/21/2010 from [facility].  Findings: There are scattered fibroglandular densities. Left  lumpectomy changes are present. There is no dominant mass,  nonsurgical architectural distortion or calcification to suggest  malignancy.  Mammographic images were processed with CAD.  IMPRESSION:  No mammographic evidence of malignancy.  RECOMMENDATION:  Yearly diagnostic mammography is suggested.  BI-RADS CATEGORY 2: Benign finding(s).   Assessment    History of breast cancer stage I      Plan   follow up 1 year.  Neuropathy better on gabapentin,    Heather Schaefer A. 04/25/2012, 5:33 PM

## 2012-04-25 NOTE — Patient Instructions (Signed)
Return 1 year. 

## 2012-04-30 ENCOUNTER — Telehealth: Payer: Self-pay | Admitting: Oncology

## 2012-04-30 NOTE — Telephone Encounter (Signed)
Per desk nures/Tami moved 1/22 appt from KK to SW. lmonvm for pt re new time for 10:30 am.

## 2012-05-07 ENCOUNTER — Other Ambulatory Visit: Payer: Self-pay | Admitting: *Deleted

## 2012-05-07 NOTE — Telephone Encounter (Signed)
She can have refills on gabapentin

## 2012-05-07 NOTE — Telephone Encounter (Signed)
Pt called requesting refill of Gabapentin. Pt having pain/restless leg, feet hurt, its like a " wierdness" so I was taking 1 pill a day (300 mg) but I went up to 3 pills a day (total of 900 mg daily). Discussed with pt 900mg   total mg daily. Pt advised she did not realize the pill was a 300mg  pill, she thought it was 100mg  pill like before when she first started taking it. Pt advised she is not sure this is working as she still has problems with her legs.  Relayed to pt I will give her concerns to MD and call back with further instructions. Pt verbalized understanding. Pt has f/u withTuesday 01/21.

## 2012-05-07 NOTE — Telephone Encounter (Signed)
Per MD, ok to refill. Called pt advised amount 300mg  pill TID is okay to take. Pt to call if refill is still needed prior to tuesday

## 2012-05-14 ENCOUNTER — Encounter: Payer: Self-pay | Admitting: Physician Assistant

## 2012-05-14 ENCOUNTER — Telehealth: Payer: Self-pay | Admitting: Oncology

## 2012-05-14 ENCOUNTER — Other Ambulatory Visit (HOSPITAL_BASED_OUTPATIENT_CLINIC_OR_DEPARTMENT_OTHER): Payer: BC Managed Care – PPO | Admitting: Lab

## 2012-05-14 ENCOUNTER — Ambulatory Visit (HOSPITAL_BASED_OUTPATIENT_CLINIC_OR_DEPARTMENT_OTHER): Payer: BC Managed Care – PPO | Admitting: Physician Assistant

## 2012-05-14 VITALS — BP 112/77 | HR 80 | Temp 97.5°F | Resp 20 | Ht 66.5 in | Wt 196.2 lb

## 2012-05-14 DIAGNOSIS — Z170 Estrogen receptor positive status [ER+]: Secondary | ICD-10-CM

## 2012-05-14 DIAGNOSIS — C50319 Malignant neoplasm of lower-inner quadrant of unspecified female breast: Secondary | ICD-10-CM

## 2012-05-14 LAB — CBC WITH DIFFERENTIAL/PLATELET
Basophils Absolute: 0 10*3/uL (ref 0.0–0.1)
Eosinophils Absolute: 0.1 10*3/uL (ref 0.0–0.5)
HGB: 13.3 g/dL (ref 11.6–15.9)
MONO#: 0.3 10*3/uL (ref 0.1–0.9)
NEUT#: 2.3 10*3/uL (ref 1.5–6.5)
RDW: 13.1 % (ref 11.2–14.5)
lymph#: 2.2 10*3/uL (ref 0.9–3.3)

## 2012-05-14 LAB — COMPREHENSIVE METABOLIC PANEL (CC13)
AST: 17 U/L (ref 5–34)
Alkaline Phosphatase: 60 U/L (ref 40–150)
BUN: 11 mg/dL (ref 7.0–26.0)
Creatinine: 1 mg/dL (ref 0.6–1.1)
Glucose: 103 mg/dl — ABNORMAL HIGH (ref 70–99)

## 2012-05-14 NOTE — Progress Notes (Signed)
Heather [facility]  [phone] [phone]   OFFICE PROGRESS NOTE   Cc:  Bernita Raisin., MD           [address]            [place] [address]   Dr. Lurline Hare  Dr. Harriette Bouillon     DIAGNOSIS: 47 year old female with stage I (TI A. 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   PRIOR THERAPY:  #1 patient underwent a left breast lumpectomy with sentinel node biopsy with the final pathology revealing a high-grade invasive ductal carcinoma measuring 1.1 cm with associated high-grade ductal carcinoma in situ with 4 sentinel nodes were negative for metastatic disease. The tumor was ER positive PR negative HER-2/neu negative with a proliferation marker 97%.   #2 status post 4 cycles of dose dense Adriamycin Cytoxan   #3 status post 9 weeks of single agent Taxol. Her last treatment was given on 02/26/2011. Total of 12 weeks of Taxol were planned but do to significant grade 3 peripheral neuropathy the treatment was stopped earlier   #4 S/P radiation therapy to the left breast completed on 05/07/11   CURRENT THERAPY: Tamoxifen 20 mg po daily starting 05/18/11     Heather Schaefer 47 y.o. female returns for a follow up appointment.  Overall she is doing well she is tolerating the tamoxifen quite nicely with some hot flashes but they're very much tolerable. Patient is also losing weight she is exercising at [facility]. She denies any headaches double vision blurring of vision.No fevers, chills ,night sweats, no shortness of breath, no cough ,hemoptysis, hematemesis or any other bleeding problems.No vaginal bleeding. Denies arthralgia, myalgia. She admits to minimal forgetfulness, which does not interfere with her ADLs. Rest of the 10 point review of systems in negative.   Her last MGM was on 10/09/2011, negative for new findings. Her last visit with Dr. Luisa Hart took place on 04/25/12,  surgically stable. R Port A Cath has been removed. She had Gyn evaluation in November 2013, Pap exam negative.    Past Medical History  Diagnosis Date  . Breast cancer   . Glaucoma   . Breast cancer, female 02/23/2011  . Nasal congestion   . Cough   . No pertinent past medical history     Past Surgical History  Procedure Date  . Breast surgery   . Laparoscopic endometriosis fulguration 2001  . Port-a-cath removal 05/29/2011    Procedure: MINOR REMOVAL PORT-A-CATH;  Surgeon: Clovis Pu. Cornett, MD;  Location: [facility];  Service: General;;    Current Outpatient Prescriptions  Medication Sig Dispense Refill  . b complex vitamins capsule Take 1 capsule by mouth daily.        . cholecalciferol (VITAMIN D) 1000 UNITS tablet Take 1,000 Units by mouth daily.        . clotrimazole-betamethasone (LOTRISONE) cream       . dextromethorphan-guaiFENesin (MUCINEX DM) 30-600 MG per 12 hr tablet Take 1 tablet by mouth every 12 (twelve) hours.        . folic acid (FOLVITE) 1 MG tablet Take 1 mg by mouth daily.        Marland Kitchen gabapentin (NEURONTIN) 300 MG capsule Take 1 capsule (300 mg total) by mouth at bedtime.  30 capsule  5  . ibuprofen (ADVIL,MOTRIN) 200 MG tablet Take 200 mg by mouth every 6 (six) hours as needed.        Marland Kitchen  loratadine (CLARITIN) 10 MG tablet Take 10 mg by mouth daily.        Marland Kitchen LORazepam (ATIVAN) 0.5 MG tablet Take 1 tablet (0.5 mg total) by mouth every 6 (six) hours as needed for anxiety.  90 tablet  0  . magnesium 30 MG tablet Take 30 mg by mouth 2 (two) times daily.      . tamoxifen (NOLVADEX) 10 MG tablet Take 10 mg by mouth daily.       . TRAVATAN Z 0.004 % SOLN ophthalmic solution       . vitamin E 100 UNIT capsule Take by mouth 3 (three) times daily.          ALLERGIES:  is allergic to hydrocodone.  REVIEW OF SYSTEMS: rest of the remainder 10 point ROS is negative.  Filed Vitals:   05/14/12 1123  BP: 112/77  Pulse: 80  Temp: 97.5 F (36.4 C)  Resp: 20     Wt Readings from Last 3 Encounters:  05/14/12 196 lb 3.2 oz (88.996 kg)  04/25/12 202 lb (91.627 kg)  11/01/11 193 lb 1.6 oz (87.59 kg)     PHYSICAL EXAMINATION:  BP 112/77  Pulse 80  Temp 97.5 F (36.4 C) (Oral)  Resp 20  Ht 5' 6.5" (1.689 m)  Wt 196 lb 3.2 oz (88.996 kg)  BMI 31.19 kg/m2  LMP 08/28/2010 GENERAL: Well developed, well nourished, in no acute distress.  EENT: No ocular or oral lesions. No stomatitis.  RESPIRATORY: Lungs are clear to auscultation bilaterally CARDIAC: No murmur, rub or tachycardia. No upper or lower extremity edema.  GI: Abdomen is soft, no palpable hepatosplenomegaly. No fluid wave. No tenderness. Musculoskeletal: No kyphosis, no tenderness over the spine, ribs or hips. Lymph: No cervical, infraclavicular, axillary or inguinal adenopathy. Neuro: No focal neurological deficits. Psych: Alert and oriented X 3, appropriate mood and affect.  BREAST EXAM: Right breast no masses or nipple discharge or skin changes. Left breast well-healed surgical scar there is some darkening of the skin with redness do to radiation otherwise no masses or nipple discharge.      LABORATORY/RADIOLOGY DATA:  Lab Results  Component Value Date   WBC 5.0 05/14/2012   HGB 13.3 05/14/2012   HCT 39.5 05/14/2012   PLT 246 05/14/2012   GLUCOSE 103* 05/14/2012   ALKPHOS 60 05/14/2012   ALT 19 05/14/2012   AST 17 05/14/2012   NA 141 05/14/2012   K 3.8 05/14/2012   CL 108* 05/14/2012   CREATININE 1.0 05/14/2012   BUN 11.0 05/14/2012   CO2 25 05/14/2012         ASSESSMENT:    Heather Schaefer 46 y.o. female with   1. stage I ER positive PR negative HER-2/neu negative invasive ductal carcinoma of the left breast status post lumpectomy with the final pathology revealing a 1.1 cm high-grade invasive ductal carcinoma with associated ductal carcinoma in situ. 4 sentinel nodes were negative for metastatic disease. Patient did have a high proliferation marker of 97%.   2.She is  now status post adjuvant chemotherapy consisting of 4 cycles of dose dense a.c. Followed by 9 weeks of single agent weekly Taxol. Overall she tolerated the treatment relatively well even though we did have to reduce the number of cycles of Taxol she received by 3.went on to complete radiation therapy. She was begun on tamoxifen 20 mg daily and she is tolerating this very well.   PLAN:   1 patient will continue tamoxifen 20 mg on a  daily basis she understands the risks and benefits she knows to look for any kind of side effects.   #2 we will plan on seeing her back in 6 months time she will continue to get mammograms on a yearly basis.   All questions were answered. The patient knows to call the clinic with any problems, questions or concerns. We can certainly see the patient much sooner if necessary.    Time spent 25 minutes counseling the patient face to face. The total time spent in the appointment was 30 minutes.

## 2012-05-14 NOTE — Telephone Encounter (Signed)
appts made and printed for pt aom °

## 2012-05-15 NOTE — Progress Notes (Signed)
Quick Note:  Call patient: take vitamin D3 2000 iu daily ______ 

## 2012-05-16 ENCOUNTER — Telehealth: Payer: Self-pay | Admitting: Medical Oncology

## 2012-05-16 NOTE — Telephone Encounter (Signed)
LVMOM with patient, per MD, patient to take vitamin D3 2000iu daily. Requested patient to return call letting us know she recv'd message and with any questions or concerns.

## 2012-05-16 NOTE — Telephone Encounter (Signed)
Message copied by Rexene Edison on Fri May 16, 2012 10:04 AM ------      Message from: Victorino December      Created: Thu May 15, 2012  5:15 PM       Call patient: take vitamin D3 2000iu daily

## 2012-05-20 ENCOUNTER — Other Ambulatory Visit: Payer: Self-pay | Admitting: Oncology

## 2012-06-07 ENCOUNTER — Other Ambulatory Visit: Payer: Self-pay

## 2012-06-16 ENCOUNTER — Other Ambulatory Visit: Payer: Self-pay | Admitting: Oncology

## 2012-06-16 DIAGNOSIS — C50312 Malignant neoplasm of lower-inner quadrant of left female breast: Secondary | ICD-10-CM

## 2012-07-01 ENCOUNTER — Ambulatory Visit (HOSPITAL_BASED_OUTPATIENT_CLINIC_OR_DEPARTMENT_OTHER): Payer: BC Managed Care – PPO | Admitting: Nurse Practitioner

## 2012-07-01 VITALS — BP 118/80 | HR 76 | Temp 98.0°F | Resp 20 | Ht 66.5 in | Wt 194.8 lb

## 2012-07-01 DIAGNOSIS — C50312 Malignant neoplasm of lower-inner quadrant of left female breast: Secondary | ICD-10-CM

## 2012-07-01 DIAGNOSIS — N649 Disorder of breast, unspecified: Secondary | ICD-10-CM

## 2012-07-01 DIAGNOSIS — F419 Anxiety disorder, unspecified: Secondary | ICD-10-CM

## 2012-07-01 DIAGNOSIS — F411 Generalized anxiety disorder: Secondary | ICD-10-CM

## 2012-07-01 DIAGNOSIS — C50319 Malignant neoplasm of lower-inner quadrant of unspecified female breast: Secondary | ICD-10-CM

## 2012-07-01 NOTE — Progress Notes (Signed)
[facility] OFFICE PROGRESS NOTE  [name],[name], MD  DIAGNOSIS: 47 year old female with stage I (TI A. 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   PAST THERAPY:  1.LEFT  breast lumpectomy with sentinel node biopsy with the final pathology revealing a high-grade invasive ductal carcinoma measuring 1.1 cm with associated high-grade ductal carcinoma in situ with 4 sentinel nodes were negative for metastatic disease.  2. Status post 4 cycles of dose dense Adriamycin Cytoxan  3. Status post 9 weeks of single agent Taxol. Her last treatment was given on 02/26/2011. Total of 12 weeks of Taxol were planned but do to significant grade 3 peripheral neuropathy the treatment was stopped earlier  4. S/P radiation therapy to the left breast completed on 05/07/11    CURRENT THERAPY: Tamoxifen 20 mg - started 05/18/2011  INTERVAL HISTORY: Heather Schaefer 47 y.o. female returns for unscheduled follow up due to an area she felt in her right axilla during the shower. She describes it as being about half the size of a frozen pea, or a 1/4 of her fingertip, and that she wasn't entirely sure when she felt it what she was feeling.  She cannot locate it for me today, nor is she absolutely certain where in her axilla it was, but she was terrified this may represent a recurrence. She recently has begun an aggressive exercise program and has had muscle soreness in her arms and upper torso in general. Physical exam today was completely negative. I discussed with her that she may have felt a normal lymph node, hormonal changes causing a variation in what she is able to palpate, or a small muscle knot, or may represent fatty tissue, or a sebaceous cyst. I do not think it represents in any way any sort of recurrence or possible malignancy. She did relate she has also seen her Ob/GYN physician for this same concern and he told her it was just fatty  tissue she was feeling. She was tearful and anxious today, and visibly relieved when advised that no further follow up for this is needed unless it becomes palpable all the time and/or larger.   MEDICAL HISTORY: Past Medical History  Diagnosis Date  . Breast cancer   . Glaucoma   . Breast cancer, female 02/23/2011  . Nasal congestion   . Cough   . No pertinent past medical history     SURGICAL HISTORY:  Past Surgical History  Procedure Laterality Date  . Breast surgery    . Laparoscopic endometriosis fulguration  2001  . Port-a-cath removal  05/29/2011    Procedure: MINOR REMOVAL PORT-A-CATH;  Surgeon: Clovis Pu. Cornett, MD;  Location: [facility];  Service: General;;    MEDICATIONS: Current Outpatient Prescriptions  Medication Sig Dispense Refill  . b complex vitamins capsule Take 1 capsule by mouth daily.        . cholecalciferol (VITAMIN D) 1000 UNITS tablet Take 1,000 Units by mouth daily.        . clotrimazole-betamethasone (LOTRISONE) cream       . dextromethorphan-guaiFENesin (MUCINEX DM) 30-600 MG per 12 hr tablet Take 1 tablet by mouth every 12 (twelve) hours.        . folic acid (FOLVITE) 1 MG tablet Take 1 mg by mouth daily.        Marland Kitchen gabapentin (NEURONTIN) 300 MG capsule Take 1 capsule (300 mg total) by mouth at bedtime.  30 capsule  5  .  ibuprofen (ADVIL,MOTRIN) 200 MG tablet Take 200 mg by mouth every 6 (six) hours as needed.        . loratadine (CLARITIN) 10 MG tablet Take 10 mg by mouth daily.        Marland Kitchen LORazepam (ATIVAN) 0.5 MG tablet TAKE ONE TABLET BY MOUTH EVERY 6 HOURS AS NEEDED FOR ANXIETY  90 tablet  0  . magnesium 30 MG tablet Take 30 mg by mouth 2 (two) times daily.      . tamoxifen (NOLVADEX) 20 MG tablet TAKE ONE TABLET BY MOUTH EVERY DAY  30 tablet  5  . TRAVATAN Z 0.004 % SOLN ophthalmic solution       . vitamin E 100 UNIT capsule Take by mouth 3 (three) times daily.         No current facility-administered medications for this visit.     ALLERGIES:  is allergic to hydrocodone.  REVIEW OF SYSTEMS: General: denies unexplained weight loss, fatigue, night sweats, fevers, chills HEENT: denies headaches, blurred vision, dizziness, loss of balance - admits she  has had some isolated occasional headaches Cardiac: denies chest pain, pressure or palpitations Lungs: denies wheezing, shortness of breath or productive cough Abd: denies nausea, vomiting, constipation, diarrhea, indigestion, blood in stool or urine, dysphagia Extremities:  - admits she has joint pain esp at night, and some residual chemo induced numbness/ tingling in feet  The rest of the 14-point review of system was negative.   Filed Vitals:   07/01/12 1512  BP: 118/80  Pulse: 76  Temp: 98 F (36.7 C)  Resp: 20   Wt Readings from Last 3 Encounters:  07/01/12 194 lb 12.8 oz (88.361 kg)  05/14/12 196 lb 3.2 oz (88.996 kg)  04/25/12 202 lb (91.627 kg)   ECOG Performance status: 0  PHYSICAL EXAMINATION: 47 yr old Caucasian anxious female who appears her stated age  General:  well-nourished in no acute distress.   Eyes:  no scleral icterus.   ENT:  There were no oropharyngeal lesions.  Neck was without thyromegaly.   Lymphatics:  Negative cervical, supraclavicular, axillary, or inguinal adenopathy.  Respiratory: lungs were clear bilaterally without wheezing or crackles.  Cardiovascular:  Regular rate and rhythm, S1/S2, without murmur, rub or gallop.  There was no pedal edema.   Breast: no nipple retraction, no nipple discharge, no unusual masses or thickening, negative for palpable abnormalities, surgical scar noted to left breast. Both axillae very carefully examined and palpated in sitting and lying positions. No abnormality noted.  GI:  abdomen was soft, flat, nontender, nondistended, without organomegaly.   Musculoskeletal:  no spinal tenderness of palpation of vertebral spine.   Skin exam was without echymosis, petichae.   Neuro exam was nonfocal.   Cranial nerves II- XII grossly intact Patient was able to get on and off exam table without assistance.  Gait was normal.  Patient was alerted and oriented.  Attention was good.   Language was appropriate.  Mood was normal without depression.  Speech was not pressured.  Thought content was not tangential.         LABORATORY/RADIOLOGY DATA:  Lab Results  Component Value Date   WBC 5.0 05/14/2012   HGB 13.3 05/14/2012   HCT 39.5 05/14/2012   PLT 246 05/14/2012   GLUCOSE 103* 05/14/2012   ALT 19 05/14/2012   AST 17 05/14/2012   NA 141 05/14/2012   K 3.8 05/14/2012   CL 108* 05/14/2012   CREATININE 1.0 05/14/2012   BUN 11.0 05/14/2012  CO2 25 05/14/2012    No results found.     ASSESSMENT AND PLAN: 1. Stage I ER positive PR negative HER-2/neu negative invasive ductal carcinoma of the left breast status post lumpectomy with the final pathology revealing a 1.1 cm high-grade invasive ductal carcinoma with associated ductal carcinoma in situ. 0/4 sentinel nodes were negative for metastatic disease. Patient did have a high proliferation marker of 97%.    2.She is now status post adjuvant chemotherapy consisting of 4 cycles of dose dense a.c. Followed by 9 weeks of single agent weekly Taxol. Overall she tolerated the treatment relatively well even though we did have to reduce the number of cycles of Taxol she received by 3.went on to complete radiation therapy. She was begun on tamoxifen 20 mg daily and she is tolerating this very well.    Patient will continue on Tamoxifen. She knows to call in the next month if the previously palpated area in her right axilla becomes palpable all the time or becomes larger. Otherwise she will follow up as scheduled in July with annual screening mamm in June. Patient and husband voiced agreement with this plan. Discussed plan of care with Dr. Welton Flakes who agrees.     Bobbe Medico, AOCNP, NP-C

## 2012-07-07 ENCOUNTER — Other Ambulatory Visit: Payer: Self-pay | Admitting: Emergency Medicine

## 2012-07-07 ENCOUNTER — Other Ambulatory Visit: Payer: Self-pay | Admitting: Oncology

## 2012-07-15 ENCOUNTER — Telehealth: Payer: Self-pay | Admitting: Medical Oncology

## 2012-07-15 MED ORDER — GABAPENTIN 300 MG PO CAPS: 300.0000 mg | ORAL_CAPSULE | Freq: Three times a day (TID) | ORAL | Status: DC

## 2012-07-15 NOTE — Telephone Encounter (Signed)
Pt LVMOM requesting update to neurontin prescription to reflect that it has been updated to taking 3x/day d/t insurance purposes. LVMOM with patient to inform her that Per MD, ok and new prescription sent to her pharmacy listed. Patient to call back with any questions or concerns.

## 2012-09-09 ENCOUNTER — Other Ambulatory Visit: Payer: Self-pay | Admitting: Emergency Medicine

## 2012-09-09 MED ORDER — LORAZEPAM 0.5 MG PO TABS: 0.5000 mg | ORAL_TABLET | Freq: Four times a day (QID) | ORAL | Status: DC | PRN

## 2012-09-22 ENCOUNTER — Encounter: Payer: Self-pay | Admitting: Medical Oncology

## 2012-09-23 ENCOUNTER — Other Ambulatory Visit: Payer: Self-pay | Admitting: Oncology

## 2012-09-23 DIAGNOSIS — Z853 Personal history of malignant neoplasm of breast: Secondary | ICD-10-CM

## 2012-10-31 ENCOUNTER — Ambulatory Visit
Admission: RE | Admit: 2012-10-31 | Discharge: 2012-10-31 | Disposition: A | Discharge status: Home / Self Care | Payer: BC Managed Care – PPO | Source: Ambulatory Visit | Attending: Oncology | Admitting: Oncology

## 2012-10-31 DIAGNOSIS — Z853 Personal history of malignant neoplasm of breast: Secondary | ICD-10-CM

## 2012-11-10 ENCOUNTER — Other Ambulatory Visit: Payer: Self-pay | Admitting: Medical Oncology

## 2012-11-10 ENCOUNTER — Other Ambulatory Visit: Payer: Self-pay | Admitting: *Deleted

## 2012-11-10 DIAGNOSIS — C50312 Malignant neoplasm of lower-inner quadrant of left female breast: Secondary | ICD-10-CM

## 2012-11-10 MED ORDER — LORAZEPAM 0.5 MG PO TABS: 0.5000 mg | ORAL_TABLET | Freq: Four times a day (QID) | ORAL | Status: DC | PRN

## 2012-11-10 MED ORDER — TAMOXIFEN CITRATE 20 MG PO TABS: ORAL_TABLET | ORAL | Status: DC

## 2012-11-10 NOTE — Telephone Encounter (Signed)
LVMOM, prescription refill for tamoxifen sent to her pharmacy per MD. Patient to call office with any questions or concerns.

## 2012-11-12 ENCOUNTER — Telehealth: Payer: Self-pay | Admitting: Oncology

## 2012-11-12 ENCOUNTER — Ambulatory Visit (HOSPITAL_BASED_OUTPATIENT_CLINIC_OR_DEPARTMENT_OTHER): Payer: BC Managed Care – PPO | Admitting: Oncology

## 2012-11-12 ENCOUNTER — Encounter: Payer: Self-pay | Admitting: Oncology

## 2012-11-12 VITALS — BP 116/79 | HR 80 | Temp 97.7°F | Resp 20 | Ht 66.5 in | Wt 196.0 lb

## 2012-11-12 DIAGNOSIS — R51 Headache: Secondary | ICD-10-CM

## 2012-11-12 DIAGNOSIS — C50312 Malignant neoplasm of lower-inner quadrant of left female breast: Secondary | ICD-10-CM

## 2012-11-12 DIAGNOSIS — C50319 Malignant neoplasm of lower-inner quadrant of unspecified female breast: Secondary | ICD-10-CM

## 2012-11-12 DIAGNOSIS — R519 Headache, unspecified: Secondary | ICD-10-CM

## 2012-11-12 MED ORDER — VENLAFAXINE HCL ER 37.5 MG PO CP24: 37.5000 mg | ORAL_CAPSULE | Freq: Every day | ORAL | Status: DC

## 2012-11-12 NOTE — Progress Notes (Signed)
OFFICE PROGRESS NOTE  CC Dr. Lurline Hare Dr. Harriette Bouillon [name],[name], MD [address] [place] [address]  DIAGNOSIS: 47 year old female with stage I (TI A. 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  PRIOR THERAPY:  #1 patient underwent a left breast lumpectomy with sentinel node biopsy with the final pathology revealing a high-grade invasive ductal carcinoma measuring 1.1 cm with associated high-grade ductal carcinoma in situ with 4 sentinel nodes were negative for metastatic disease. The tumor was ER positive PR negative HER-2/neu negative with a proliferation marker 97%.  #2 status post 4 cycles of dose dense Adriamycin Cytoxan  #3 status post 9 weeks of single agent Taxol. Her last treatment was given on 02/26/2011. Total of 12 weeks of Taxol were planned but do to significant grade 3 peripheral neuropathy the treatment was stopped earlier  #4 S/P radiation therapy to the left breast completed on 05/07/11  CURRENT THERAPY: Tamoxifen 20 mg po daily starting 05/18/11  INTERVAL HISTORY: Heather Schaefer 47 y.o. female returns for followup visit today. Overall she is doing well she is tolerating the tamoxifen. She is experiencing significant menopausal symptoms. She had been exercising and had gone on to lose some weight. However over the past few weeks she has gained it back. She is very frustrated from this. She also tells me that she experiences what sounds like panic or/anxiety attacks. She has been trying to get herself off of Ativan and did develop withdrawal symptoms. she denies any headaches double vision blurring of vision no fevers chills night sweats no shortness of breath no cough hemoptysis hematemesis or any other bleeding problems no vaginal bleeding remainder of the 10 point review of systems is negative. MEDICAL HISTORY: Past Medical History  Diagnosis Date  . Breast cancer   .  Glaucoma   . Breast cancer, female 02/23/2011  . Nasal congestion   . Cough   . No pertinent past medical history     ALLERGIES:  is allergic to hydrocodone.  MEDICATIONS:  Current Outpatient Prescriptions  Medication Sig Dispense Refill  . b complex vitamins capsule Take 1 capsule by mouth daily.        . cholecalciferol (VITAMIN D) 1000 UNITS tablet Take 1,000 Units by mouth daily.        . folic acid (FOLVITE) 1 MG tablet Take 1 mg by mouth daily.        Marland Kitchen gabapentin (NEURONTIN) 300 MG capsule Take 1 capsule (300 mg total) by mouth 3 (three) times daily.  90 capsule  5  . ibuprofen (ADVIL,MOTRIN) 200 MG tablet Take 200 mg by mouth every 6 (six) hours as needed.        . magnesium 30 MG tablet Take 30 mg by mouth 2 (two) times daily.      . tamoxifen (NOLVADEX) 20 MG tablet TAKE ONE TABLET BY MOUTH EVERY DAY  30 tablet  5  . TRAVATAN Z 0.004 % SOLN ophthalmic solution       . vitamin E 100 UNIT capsule Take by mouth 3 (three) times daily.        Marland Kitchen ALPRAZolam (XANAX) 0.5 MG tablet as needed.      . loratadine (CLARITIN) 10 MG tablet Take 10 mg by mouth daily.        Marland Kitchen LORazepam (ATIVAN) 0.5 MG tablet Take 1 tablet (0.5 mg total) by mouth every 6 (six) hours as needed for anxiety.  90 tablet  0  .  venlafaxine XR (EFFEXOR-XR) 37.5 MG 24 hr capsule Take 1 capsule (37.5 mg total) by mouth daily.  30 capsule  6   No current facility-administered medications for this visit.    SURGICAL HISTORY:  Past Surgical History  Procedure Laterality Date  . Breast surgery    . Laparoscopic endometriosis fulguration  2001  . Port-a-cath removal  05/29/2011    Procedure: MINOR REMOVAL PORT-A-CATH;  Surgeon: Clovis Pu. Cornett, MD;  Location: [facility];  Service: General;;    REVIEW OF SYSTEMS:  Pertinent items are noted in HPI.   PHYSICAL EXAMINATION: General appearance: alert, cooperative and appears stated age Head: Normocephalic, without obvious abnormality, atraumatic Neck: no  adenopathy, no carotid bruit, no JVD, supple, symmetrical, trachea midline and thyroid not enlarged, symmetric, no tenderness/mass/nodules Lymph nodes: Cervical, supraclavicular, and axillary nodes normal. Resp: clear to auscultation bilaterally and normal percussion bilaterally Back: symmetric, no curvature. ROM normal. No CVA tenderness. Cardio: regular rate and rhythm, S1, S2 normal, no murmur, click, rub or gallop and normal apical impulse GI: soft, non-tender; bowel sounds normal; no masses,  no organomegaly Extremities: extremities normal, atraumatic, no cyanosis or edema Neurologic: Alert and oriented X 3, normal strength and tone. Normal symmetric reflexes. Normal coordination and gait Bilateral breasts are examined: Right breast no masses or nipple discharge or skin changes. Left breast well-healed surgical scar there is some darkening of the skin with redness do to radiation otherwise no masses or nipple discharge the ECOG PERFORMANCE STATUS: 0 - Asymptomatic  Blood pressure 116/79, pulse 80, temperature 97.7 F (36.5 C), temperature source Oral, resp. rate 20, height 5' 6.5" (1.689 m), weight 196 lb (88.905 kg), last menstrual period 08/28/2010.  LABORATORY DATA: Lab Results  Component Value Date   WBC 5.0 05/14/2012   HGB 13.3 05/14/2012   HCT 39.5 05/14/2012   MCV 91.4 05/14/2012   PLT 246 05/14/2012      Chemistry      Component Value Date/Time   NA 141 05/14/2012 1042   NA 140 05/18/2011 1146   K 3.8 05/14/2012 1042   K 4.2 05/18/2011 1146   CL 108* 05/14/2012 1042   CL 106 05/18/2011 1146   CO2 25 05/14/2012 1042   CO2 24 05/18/2011 1146   BUN 11.0 05/14/2012 1042   BUN 12 05/18/2011 1146   CREATININE 1.0 05/14/2012 1042   CREATININE 0.92 05/18/2011 1146      Component Value Date/Time   CALCIUM 9.5 05/14/2012 1042   CALCIUM 9.8 05/18/2011 1146   ALKPHOS 60 05/14/2012 1042   ALKPHOS 74 05/18/2011 1146   AST 17 05/14/2012 1042   AST 23 05/18/2011 1146   ALT 19 05/14/2012 1042    ALT 23 05/18/2011 1146   BILITOT 0.53 05/14/2012 1042   BILITOT 0.6 05/18/2011 1146       RADIOGRAPHIC STUDIES:  No results found.  ASSESSMENT: 47 year old female with:  1.  stage I ER positive PR negative HER-2/neu negative invasive ductal carcinoma of the left breast status post lumpectomy with the final pathology revealing a 1.1 cm high-grade invasive ductal carcinoma with associated ductal carcinoma in situ. 4 sentinel nodes were negative for metastatic disease. Patient did have a high proliferation marker of 97%.   2. status post adjuvant chemotherapy consisting of 4 cycles of dose dense a.c. Followed by 9 weeks of single agent weekly Taxol. Overall she tolerated the treatment relatively well even though we did have to reduce the number of cycles of Taxol she received by  3.went on to complete radiation therapy. She was begun on tamoxifen 20 mg daily and she is tolerating this very well.  3. Patient with significant menopausal symptoms. We extensively discussed this today. I do think she would benefit from Effexor  37.5 mg daily.  4. Patient has been on Ativan for quite some time along with Xanax. She feels that she is experiencing too many symptoms from these drugs. She try to wean herself off of them unfortunately developed withdrawal. We extensively discussed how to prevent this and still be able to get her off the Ativan. She is very motivated to do this. I have recommended that she take Ativan 0.25 mg daily for a month followed by 0.25 mg every other day for a month and so on to eventually be off of it completely.  5 we discussed exercise eating healthy, yoga, acupuncture as a treatment for her hot flashes and overall well being   PLAN: #1 continue tamoxifen 20 mg daily.  #2 wean off of Ativan  #3 begin Effexor 37.5 mg daily when she feels that she will be able to tolerate it after weaning herself off Ativan.  All questions were answered. The patient knows to call the clinic with  any problems, questions or concerns. We can certainly see the patient much sooner if necessary.  I spent 25 minutes counseling the patient face to face. The total time spent in the appointment was 30 minutes.    Drue Second, MD Medical/Oncology [facility] [phone] (beeper) [phone] (Office)  11/12/2012, 4:51 PM

## 2012-11-12 NOTE — Telephone Encounter (Signed)
gv pt appt schedule for January 2015. °

## 2012-11-21 ENCOUNTER — Telehealth: Payer: Self-pay | Admitting: Medical Oncology

## 2012-11-21 NOTE — Telephone Encounter (Signed)
Patient LVMOM requesting to know wether she can take a supplement to lose weight.  LVMOM.  Per MD, advised patient to continue with exercising and to incorporate a healthy diet in her daily routine. Informed pt that MD advises against weight loss supplements. Patient to call office with any questions or concerns.

## 2012-12-05 ENCOUNTER — Telehealth: Payer: Self-pay | Admitting: Oncology

## 2012-12-10 ENCOUNTER — Other Ambulatory Visit: Payer: Self-pay | Admitting: Medical Oncology

## 2012-12-10 DIAGNOSIS — C50312 Malignant neoplasm of lower-inner quadrant of left female breast: Secondary | ICD-10-CM

## 2012-12-10 NOTE — Telephone Encounter (Signed)
Pt LVMOM stating needing refill of tamoxifen. Called pt's pharmacy [facility] in [place], and they state prescription is there and ready for her to p.u. Return call to pt and LVMOM for patient informing her that her prescription is ready at [facility] and that pt has 5 refills. Patient to call back with any questions or concerns.

## 2013-01-29 ENCOUNTER — Other Ambulatory Visit: Payer: Self-pay | Admitting: Physician Assistant

## 2013-01-29 ENCOUNTER — Telehealth: Payer: Self-pay | Admitting: *Deleted

## 2013-01-29 DIAGNOSIS — N6310 Unspecified lump in the right breast, unspecified quadrant: Secondary | ICD-10-CM

## 2013-01-29 NOTE — Telephone Encounter (Signed)
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 This RN per her request palpated area and was able to locate in described area a small linear hardness with more cordlike feeling.  Heather Schaefer states this area is new. Breast is more tender.  This RN inquired about current menses cycle-  She is not sure of hormonal cycle due to " I haven't had my period in 2 years and I am on tamoxifen ".  This RN obtained assessment by AB/PA who recommendation diagnostic mammo with U/S if needed.  This RN also discussed with pt possibility of menses recurring due to age and natural body function. This RN also reiterated concern relating to concern for pregnancy due to her age an unknown ovarian function.  mammo with possible U/S scheduled for 10/14 at 1030am at the breast center. Message left on pt's VM.

## 2013-02-02 ENCOUNTER — Ambulatory Visit
Admission: RE | Admit: 2013-02-02 | Discharge: 2013-02-02 | Disposition: A | Discharge status: Home / Self Care | Payer: BC Managed Care – PPO | Source: Ambulatory Visit | Attending: Physician Assistant | Admitting: Physician Assistant

## 2013-02-02 ENCOUNTER — Telehealth: Payer: Self-pay | Admitting: Medical Oncology

## 2013-02-02 DIAGNOSIS — N6310 Unspecified lump in the right breast, unspecified quadrant: Secondary | ICD-10-CM

## 2013-02-02 NOTE — Telephone Encounter (Signed)
Patient LVMOM  Stating she called last week Wednesday and "upset" she has not heard back from office. States she is "really unhappy she hasn't heard back from office" regarding mammogram/US appt   Return call to patient to [facility] d/t no return call. LVMOM with patient and asked pt to call office if there is anything I can do to help.  Pt with sched appt today, Mammogram @ 3:00 and ultrasound @ 3:20

## 2013-02-11 ENCOUNTER — Other Ambulatory Visit: Payer: BC Managed Care – PPO

## 2013-02-26 ENCOUNTER — Other Ambulatory Visit: Payer: Self-pay

## 2013-04-02 ENCOUNTER — Telehealth: Payer: Self-pay | Admitting: Emergency Medicine

## 2013-04-02 NOTE — Telephone Encounter (Signed)
Patient called stating that she is out of town and forgot her Tamoxifen at home.  Returned patient's call; left message on her voicemail.  Instructed patient that she could restart the Tamoxifen on Sunday 12/14 when she returns home or if she would like we could send her a prescription to a nearby pharmacy of her choice.   Instructed patient to call this office with pharmacy info if she would like a RX called into a nearby pharmacy.

## 2013-04-07 ENCOUNTER — Encounter (INDEPENDENT_AMBULATORY_CARE_PROVIDER_SITE_OTHER): Payer: Self-pay | Admitting: Surgery

## 2013-04-10 ENCOUNTER — Telehealth: Payer: Self-pay | Admitting: Emergency Medicine

## 2013-04-10 NOTE — Telephone Encounter (Signed)
Patient called with concerns about vaginal spotting, which has resolved, and cramping. Patient states she as a yearly appointment with her GYN on 1/6. Instructed patient to keep that appointment. Patient has resumed the Tamoxifen. Patient also instructed to keep her 1/22 appointment with Dr Welton Flakes. Patient verbalized understanding.

## 2013-04-12 ENCOUNTER — Other Ambulatory Visit: Payer: Self-pay | Admitting: Oncology

## 2013-04-12 DIAGNOSIS — C50312 Malignant neoplasm of lower-inner quadrant of left female breast: Secondary | ICD-10-CM

## 2013-05-14 ENCOUNTER — Ambulatory Visit (HOSPITAL_BASED_OUTPATIENT_CLINIC_OR_DEPARTMENT_OTHER): Payer: BC Managed Care – PPO | Admitting: Oncology

## 2013-05-14 ENCOUNTER — Ambulatory Visit: Payer: BC Managed Care – PPO | Admitting: Oncology

## 2013-05-14 ENCOUNTER — Encounter: Payer: Self-pay | Admitting: Oncology

## 2013-05-14 ENCOUNTER — Other Ambulatory Visit (HOSPITAL_BASED_OUTPATIENT_CLINIC_OR_DEPARTMENT_OTHER): Payer: BC Managed Care – PPO

## 2013-05-14 VITALS — BP 124/80 | HR 80 | Temp 98.0°F | Resp 18 | Ht 66.5 in | Wt 187.4 lb

## 2013-05-14 DIAGNOSIS — Z170 Estrogen receptor positive status [ER+]: Secondary | ICD-10-CM

## 2013-05-14 DIAGNOSIS — C50319 Malignant neoplasm of lower-inner quadrant of unspecified female breast: Secondary | ICD-10-CM

## 2013-05-14 DIAGNOSIS — C50312 Malignant neoplasm of lower-inner quadrant of left female breast: Secondary | ICD-10-CM

## 2013-05-14 LAB — CBC WITH DIFFERENTIAL/PLATELET
BASO%: 0.4 % (ref 0.0–2.0)
BASOS ABS: 0 10*3/uL (ref 0.0–0.1)
EOS ABS: 0.2 10*3/uL (ref 0.0–0.5)
EOS%: 4.4 % (ref 0.0–7.0)
HEMATOCRIT: 39.9 % (ref 34.8–46.6)
HEMOGLOBIN: 13.2 g/dL (ref 11.6–15.9)
LYMPH#: 2.4 10*3/uL (ref 0.9–3.3)
LYMPH%: 45.4 % (ref 14.0–49.7)
MCH: 30.4 pg (ref 25.1–34.0)
MCHC: 33.1 g/dL (ref 31.5–36.0)
MCV: 91.9 fL (ref 79.5–101.0)
MONO#: 0.4 10*3/uL (ref 0.1–0.9)
MONO%: 7.9 % (ref 0.0–14.0)
NEUT#: 2.2 10*3/uL (ref 1.5–6.5)
NEUT%: 41.9 % (ref 38.4–76.8)
Platelets: 266 10*3/uL (ref 145–400)
RBC: 4.34 10*6/uL (ref 3.70–5.45)
RDW: 12.9 % (ref 11.2–14.5)
WBC: 5.2 10*3/uL (ref 3.9–10.3)

## 2013-05-14 LAB — COMPREHENSIVE METABOLIC PANEL (CC13)
ALT: 19 U/L (ref 0–55)
AST: 19 U/L (ref 5–34)
Albumin: 3.8 g/dL (ref 3.5–5.0)
Alkaline Phosphatase: 48 U/L (ref 40–150)
Anion Gap: 8 mEq/L (ref 3–11)
BUN: 13.7 mg/dL (ref 7.0–26.0)
CALCIUM: 9.6 mg/dL (ref 8.4–10.4)
CO2: 24 mEq/L (ref 22–29)
CREATININE: 1 mg/dL (ref 0.6–1.1)
Chloride: 107 mEq/L (ref 98–109)
GLUCOSE: 86 mg/dL (ref 70–140)
Potassium: 4.2 mEq/L (ref 3.5–5.1)
Sodium: 140 mEq/L (ref 136–145)
Total Bilirubin: 0.48 mg/dL (ref 0.20–1.20)
Total Protein: 7 g/dL (ref 6.4–8.3)

## 2013-05-15 ENCOUNTER — Telehealth: Payer: Self-pay | Admitting: Oncology

## 2013-05-15 NOTE — Telephone Encounter (Signed)
lvm for pt regarding to July appt adn Jan 2016 apt cx...mailed pt updated sched , avs and letter

## 2013-05-15 NOTE — Telephone Encounter (Signed)
s.w. pt and advised on Jan 2016 appt...mailed appt sched, avs and letter to pt

## 2013-05-24 NOTE — Progress Notes (Signed)
OFFICE PROGRESS NOTE  CC Dr. Thea Silversmith Dr. Erroll Luna [name],[name], MD [place] [id number]  DIAGNOSIS: 48 year old female with stage I (TI A. 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  PRIOR THERAPY:  #1 patient underwent a left breast lumpectomy with sentinel node biopsy with the final pathology revealing a high-grade invasive ductal carcinoma measuring 1.1 cm with associated high-grade ductal carcinoma in situ with 4 sentinel nodes were negative for metastatic disease. The tumor was ER positive PR negative HER-2/neu negative with a proliferation marker 97%.  #2 status post 4 cycles of dose dense Adriamycin Cytoxan  #3 status post 9 weeks of single agent Taxol. Her last treatment was given on 02/26/2011. Total of 12 weeks of Taxol were planned but do to significant grade 3 peripheral neuropathy the treatment was stopped earlier  #4 S/P radiation therapy to the left breast completed on 05/07/11  CURRENT THERAPY: Tamoxifen 20 mg po daily starting 05/18/11  INTERVAL HISTORY: Heather Schaefer 48 y.o. female returns for followup visit today. Overall she is doing well she is tolerating the tamoxifen. Overall patient seems to be doing quite well now. She is still having some hot flashes here and there. She also developed an episode of cramping with some discharge and marginally. That is now resolved. She thinks she was having her period. She was seen by her gynecologist. Denies any headaches double vision or vision fevers chills she does have night sweats. She has no myalgias and arthralgias. She is eating well. She is up-to-date on her mammograms. Remainder of the 10 point review of systems is negative.  MEDICAL HISTORY: Past Medical History  Diagnosis Date  . Breast cancer   . Glaucoma   . Breast cancer, female 02/23/2011  . Nasal congestion   . Cough   . No pertinent past medical  history     ALLERGIES:  is allergic to hydrocodone.  MEDICATIONS:  Current Outpatient Prescriptions  Medication Sig Dispense Refill  . ALPRAZolam (XANAX) 0.5 MG tablet as needed.      Marland Kitchen b complex vitamins capsule Take 1 capsule by mouth daily.        . cholecalciferol (VITAMIN D) 1000 UNITS tablet Take 1,000 Units by mouth daily.        . folic acid (FOLVITE) 1 MG tablet Take 1 mg by mouth daily.        Marland Kitchen ibuprofen (ADVIL,MOTRIN) 200 MG tablet Take 200 mg by mouth every 6 (six) hours as needed.        . loratadine (CLARITIN) 10 MG tablet Take 10 mg by mouth daily.        . magnesium 30 MG tablet Take 30 mg by mouth 2 (two) times daily.      . tamoxifen (NOLVADEX) 20 MG tablet TAKE ONE TABLET BY MOUTH ONCE DAILY  30 tablet  1  . TRAVATAN Z 0.004 % SOLN ophthalmic solution       . vitamin E 100 UNIT capsule Take by mouth 3 (three) times daily.        Marland Kitchen LORazepam (ATIVAN) 0.5 MG tablet Take 1 tablet (0.5 mg total) by mouth every 6 (six) hours as needed for anxiety.  90 tablet  0   No current facility-administered medications for this visit.    SURGICAL HISTORY:  Past Surgical History  Procedure Laterality Date  . Breast surgery    . Laparoscopic endometriosis fulguration  2001  . Port-a-cath  removal  05/29/2011    Procedure: MINOR REMOVAL PORT-A-CATH;  Surgeon: Joyice Faster. Cornett, MD;  Location: [facility];  Service: General;;    REVIEW OF SYSTEMS:  Pertinent items are noted in HPI.   PHYSICAL EXAMINATION: General appearance: alert, cooperative and appears stated age Head: Normocephalic, without obvious abnormality, atraumatic Neck: no adenopathy, no carotid bruit, no JVD, supple, symmetrical, trachea midline and thyroid not enlarged, symmetric, no tenderness/mass/nodules Lymph nodes: Cervical, supraclavicular, and axillary nodes normal. Resp: clear to auscultation bilaterally and normal percussion bilaterally Back: symmetric, no curvature. ROM normal. No CVA  tenderness. Cardio: regular rate and rhythm, S1, S2 normal, no murmur, click, rub or gallop and normal apical impulse GI: soft, non-tender; bowel sounds normal; no masses,  no organomegaly Extremities: extremities normal, atraumatic, no cyanosis or edema Neurologic: Alert and oriented X 3, normal strength and tone. Normal symmetric reflexes. Normal coordination and gait Bilateral breasts are examined: Right breast no masses or nipple discharge or skin changes. Left breast well-healed surgical scar there is some darkening of the skin with redness do to radiation otherwise no masses or nipple discharge the ECOG PERFORMANCE STATUS: 0 - Asymptomatic  Blood pressure 124/80, pulse 80, temperature 98 F (36.7 C), temperature source Oral, resp. rate 18, height 5' 6.5" (1.689 m), weight 187 lb 6.4 oz (85.004 kg), last menstrual period 08/28/2010.  LABORATORY DATA: Lab Results  Component Value Date   WBC 5.2 05/14/2013   HGB 13.2 05/14/2013   HCT 39.9 05/14/2013   MCV 91.9 05/14/2013   PLT 266 05/14/2013      Chemistry      Component Value Date/Time   NA 140 05/14/2013 1002   NA 140 05/18/2011 1146   K 4.2 05/14/2013 1002   K 4.2 05/18/2011 1146   CL 108* 05/14/2012 1042   CL 106 05/18/2011 1146   CO2 24 05/14/2013 1002   CO2 24 05/18/2011 1146   BUN 13.7 05/14/2013 1002   BUN 12 05/18/2011 1146   CREATININE 1.0 05/14/2013 1002   CREATININE 0.92 05/18/2011 1146      Component Value Date/Time   CALCIUM 9.6 05/14/2013 1002   CALCIUM 9.8 05/18/2011 1146   ALKPHOS 48 05/14/2013 1002   ALKPHOS 74 05/18/2011 1146   AST 19 05/14/2013 1002   AST 23 05/18/2011 1146   ALT 19 05/14/2013 1002   ALT 23 05/18/2011 1146   BILITOT 0.48 05/14/2013 1002   BILITOT 0.6 05/18/2011 1146       RADIOGRAPHIC STUDIES:  No results found.  ASSESSMENT/PLAN: 48 year old female with:  1.  stage I ER positive PR negative HER-2/neu negative invasive ductal carcinoma of the left breast status post lumpectomy with the final  pathology revealing a 1.1 cm high-grade invasive ductal carcinoma with associated ductal carcinoma in situ. 4 sentinel nodes were negative for metastatic disease. Patient did have a high proliferation marker of 97%.   2. status post adjuvant chemotherapy consisting of 4 cycles of dose dense a.c. Followed by 9 weeks of single agent weekly Taxol. Overall she tolerated the treatment relatively well even though we did have to reduce the number of cycles of Taxol she received by 3.went on to complete radiation therapy. She was begun on tamoxifen 20 mg daily and she is tolerating this very well. She will continue this for 10 years.  3.  menopausal symptoms:. Patient will continue Effexor 37.5 mg daily  4. Patient has been on Ativan for quite some time along with Xanax. She feels that she  is experiencing too many symptoms from these drugs. She try to wean herself off of them unfortunately developed withdrawal. We extensively discussed how to prevent this and still be able to get her off the Ativan. She is very motivated to do this. I have recommended that she take Ativan 0.25 mg daily for a month followed by 0.25 mg every other day for a month and so on to eventually be off of it completely.  5 we discussed exercise eating healthy, yoga, acupuncture as a treatment for her hot flashes and overall well being   All questions were answered. The patient knows to call the clinic with any problems, questions or concerns. We can certainly see the patient much sooner if necessary.  I spent 25 minutes counseling the patient face to face. The total time spent in the appointment was 30 minutes.    Marcy Panning, MD Medical/Oncology [facility] [phone] (beeper) [phone] (Office)  05/24/2013, 1:42 PM

## 2013-08-08 ENCOUNTER — Other Ambulatory Visit: Payer: Self-pay | Admitting: Oncology

## 2013-08-10 NOTE — Telephone Encounter (Signed)
Please see Visit Info comments 

## 2013-09-08 ENCOUNTER — Telehealth: Payer: Self-pay

## 2013-09-08 NOTE — Telephone Encounter (Signed)
Hands - "fell asleep" feeling in hands and cold.  Twinges in feet.  Occurring over past two weeks.  Has not taken neurontin in a long time.  Per LC - pt should see PCP.  Pt voiced understanding.

## 2013-09-30 ENCOUNTER — Telehealth: Payer: Self-pay | Admitting: *Deleted

## 2013-10-08 ENCOUNTER — Encounter: Payer: Self-pay | Admitting: *Deleted

## 2013-10-08 NOTE — Progress Notes (Signed)
Pt here in the office and was a pt of Dr. Humphrey Rolls and requested Dr. Jana Hakim.  Cancelled Dr. Laurelyn Sickle appt and scheduled and confirmed her to see Dr. Jana Hakim on 11/17/13.  Gave calendar to pt.

## 2013-11-13 ENCOUNTER — Other Ambulatory Visit: Payer: BC Managed Care – PPO

## 2013-11-13 ENCOUNTER — Ambulatory Visit: Payer: BC Managed Care – PPO | Admitting: Oncology

## 2013-11-17 ENCOUNTER — Other Ambulatory Visit (HOSPITAL_BASED_OUTPATIENT_CLINIC_OR_DEPARTMENT_OTHER): Payer: BC Managed Care – PPO

## 2013-11-17 ENCOUNTER — Ambulatory Visit (HOSPITAL_BASED_OUTPATIENT_CLINIC_OR_DEPARTMENT_OTHER): Payer: BC Managed Care – PPO | Admitting: Oncology

## 2013-11-17 ENCOUNTER — Other Ambulatory Visit: Payer: Self-pay | Admitting: Oncology

## 2013-11-17 VITALS — BP 130/71 | HR 65 | Temp 98.4°F | Resp 18 | Ht 66.5 in | Wt 169.3 lb

## 2013-11-17 DIAGNOSIS — C50919 Malignant neoplasm of unspecified site of unspecified female breast: Secondary | ICD-10-CM

## 2013-11-17 DIAGNOSIS — C50319 Malignant neoplasm of lower-inner quadrant of unspecified female breast: Secondary | ICD-10-CM

## 2013-11-17 DIAGNOSIS — Z170 Estrogen receptor positive status [ER+]: Secondary | ICD-10-CM

## 2013-11-17 LAB — COMPREHENSIVE METABOLIC PANEL (CC13)
ALBUMIN: 3.7 g/dL (ref 3.5–5.0)
ALT: 31 U/L (ref 0–55)
AST: 26 U/L (ref 5–34)
Alkaline Phosphatase: 59 U/L (ref 40–150)
Anion Gap: 10 mEq/L (ref 3–11)
BUN: 8.7 mg/dL (ref 7.0–26.0)
CO2: 26 mEq/L (ref 22–29)
Calcium: 9.3 mg/dL (ref 8.4–10.4)
Chloride: 105 mEq/L (ref 98–109)
Creatinine: 0.9 mg/dL (ref 0.6–1.1)
Glucose: 104 mg/dl (ref 70–140)
POTASSIUM: 3.8 meq/L (ref 3.5–5.1)
SODIUM: 141 meq/L (ref 136–145)
TOTAL PROTEIN: 7 g/dL (ref 6.4–8.3)
Total Bilirubin: 0.32 mg/dL (ref 0.20–1.20)

## 2013-11-17 LAB — CBC WITH DIFFERENTIAL/PLATELET
BASO%: 0.7 % (ref 0.0–2.0)
BASOS ABS: 0 10*3/uL (ref 0.0–0.1)
EOS ABS: 0.2 10*3/uL (ref 0.0–0.5)
EOS%: 3.4 % (ref 0.0–7.0)
HCT: 38.7 % (ref 34.8–46.6)
HEMOGLOBIN: 12.8 g/dL (ref 11.6–15.9)
LYMPH%: 29.9 % (ref 14.0–49.7)
MCH: 30.6 pg (ref 25.1–34.0)
MCHC: 33 g/dL (ref 31.5–36.0)
MCV: 92.8 fL (ref 79.5–101.0)
MONO#: 0.4 10*3/uL (ref 0.1–0.9)
MONO%: 5.9 % (ref 0.0–14.0)
NEUT%: 60.1 % (ref 38.4–76.8)
NEUTROS ABS: 4.1 10*3/uL (ref 1.5–6.5)
Platelets: 269 10*3/uL (ref 145–400)
RBC: 4.17 10*6/uL (ref 3.70–5.45)
RDW: 13.9 % (ref 11.2–14.5)
WBC: 6.9 10*3/uL (ref 3.9–10.3)
lymph#: 2.1 10*3/uL (ref 0.9–3.3)

## 2013-11-17 NOTE — Progress Notes (Signed)
[facility]  Telephone:([phone]) [phone] Fax:(336) [phone]     ID: Heather Schaefer DOB: 1965/09/05  MR#: [id number]  HAL#:[id number]   PCP: Jasmine Awe., MD GYN: SU: Thomas Cornett OTHER MD: Thea Silversmith  CHIEF COMPLAINT: Estrogen receptor positive breast cancer  CURRENT TREATMENT: Tamoxifen  BREAST CANCER HISTORY: The patient had screening mammography at the breast Center 06/06/2010 showing a possible mass in the left breast. Left diagnostic mammography and ultrasonography to 06/21/2010 confirmed an oval mass in the lower inner quadrant of the left breast which was not palpable. Ultrasound showed a 0.8 cm hypoechoic smoothly marginated mass. This was biopsied 08/09/2010, and showed [facility] [phone]) and invasive ductal carcinoma, high-grade, with a brisk lymphocytic reaction. HER-2/neu was not amplified, the signals ratio being 1.11. Estrogen receptor was 56% positive, with weak staining intensity. Progesterone receptor was negative. The proliferation marker was 97%.  On 08/15/2010 the patient underwent bilateral breast MRI, which showed a solitary mass in the left breast measuring 1.3 cm. On 08/29/2010 Heather Schaefer underwent left lumpectomy and sentinel lymph node sampling, the final pathology, [id number], showing a high-grade invasive ductal carcinoma measuring 1.1 cm, with all 3 sentinel lymph nodes benign. Repeat HER-2 was again negative, with a ratio of 1.23.  Her subsequent history is as detailed below  INTERVAL HISTORY: Heather Schaefer returns today for followup of her early stage breast cancer. She is establishing herself on my service today  REVIEW OF SYSTEMS: She is tolerating tamoxifen well. She has occasional hot flashes which are not a major concern. She tells me she recently had a uterine sonogram and that according to her gynecologist "everything looked fine". She is having some problems with her contacts, and sometimes she thinks she is getting forgetful, but otherwise a  detailed review of systems today was noncontributory  PAST MEDICAL HISTORY: Past Medical History  Diagnosis Date  . Breast cancer   . Glaucoma   . Breast cancer, female 02/23/2011  . Nasal congestion   . Cough   . No pertinent past medical history     PAST SURGICAL HISTORY: Past Surgical History  Procedure Laterality Date  . Breast surgery    . Laparoscopic endometriosis fulguration  2001  . Port-a-cath removal  05/29/2011    Procedure: MINOR REMOVAL PORT-A-CATH;  Surgeon: Joyice Faster. Cornett, MD;  Location: [facility];  Service: General;;    FAMILY HISTORY No family history on file. The patient's father died at the age of 62 and a single in [place] plane crash. The patient's mother is living, age 67. The patient has one half brother and one half sister. There is no history of breast or ovarian cancer in the family. The patient did undergo genetic testing, which was negative for BRCA1 or 2 gene mutations  GYNECOLOGIC HISTORY:  Patient's last menstrual period was 08/28/2010. Menarche age 50, first live birth age 41. The patient is GX P1. She status post right salpingo-oophorectomy for endometriosis remotely. 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 Her husband Clair Gulling is Engineer, maintenance of operations for a carrier transport company. Their 38 year old daughter, Heather Schaefer, is at [employer]. They have 2 dogs and a cat at home. They attend a [employer].    ADVANCED DIRECTIVES: Not in place   HEALTH MAINTENANCE:  History  Substance Use Topics  . Smoking status: Former Smoker    Quit date: 05/27/2001  . Smokeless tobacco: Not on file  . Alcohol Use: Yes     Comment: 1-2 week     Colonoscopy:  PAP:  Bone density:  Lipid  panel:  Allergies  Allergen Reactions  . Hydrocodone Nausea And Vomiting    Current Outpatient Prescriptions  Medication Sig Dispense Refill  . ALPRAZolam (XANAX) 0.5 MG tablet as needed.      Marland Kitchen b complex vitamins capsule Take 1 capsule by mouth daily.        . cholecalciferol (VITAMIN D) 1000 UNITS tablet Take 1,000 Units by mouth daily.        . folic acid (FOLVITE) 1 MG tablet Take 1 mg by mouth daily.        Marland Kitchen ibuprofen (ADVIL,MOTRIN) 200 MG tablet Take 200 mg by mouth every 6 (six) hours as needed.        . loratadine (CLARITIN) 10 MG tablet Take 10 mg by mouth daily.        Marland Kitchen LORazepam (ATIVAN) 0.5 MG tablet Take 1 tablet (0.5 mg total) by mouth every 6 (six) hours as needed for anxiety.  90 tablet  0  . magnesium 30 MG tablet Take 30 mg by mouth 2 (two) times daily.      . tamoxifen (NOLVADEX) 20 MG tablet TAKE ONE TABLET BY MOUTH ONCE DAILY  30 tablet  11  . TRAVATAN Z 0.004 % SOLN ophthalmic solution       . vitamin E 100 UNIT capsule Take by mouth 3 (three) times daily.         No current facility-administered medications for this visit.    OBJECTIVE: Middle-aged white woman in no acute distress Filed Vitals:   11/17/13 1620  BP: 130/71  Pulse: 65  Temp: 98.4 F (36.9 C)  Resp: 18     Body mass index is 26.92 kg/(m^2).    ECOG FS:0 - Asymptomatic  Ocular: Sclerae unicteric, pupils equal, round and reactive to light Ear-nose-throat: Oropharynx clear, dentition in good repair Lymphatic: No cervical or supraclavicular adenopathy Lungs no rales or rhonchi, good excursion bilaterally Heart regular rate and rhythm, no murmur appreciated Abd soft, nontender, positive bowel sounds MSK no focal spinal tenderness, no joint edema Neuro: non-focal, well-oriented, positive affect Breasts: The right breast is unremarkable. Left breast is status post lumpectomy and radiation. There is no evidence of local recurrence. The left axilla is benign Skin: Tanned; 3 becoming silver  rings in her left pinna, one in her left nostril, and one in her right pinna.  LAB RESULTS:  CMP     Component Value Date/Time   NA 141 11/17/2013 1609   NA 140 05/18/2011 1146   K 3.8 11/17/2013 1609   K 4.2 05/18/2011 1146   CL 108* 05/14/2012 1042   CL 106 05/18/2011 1146   CO2 26 11/17/2013 1609   CO2 24 05/18/2011 1146   GLUCOSE 104 11/17/2013 1609   GLUCOSE 103* 05/14/2012 1042   GLUCOSE 86 05/18/2011 1146   BUN 8.7 11/17/2013 1609   BUN 12 05/18/2011 1146   CREATININE 0.9 11/17/2013 1609   CREATININE 0.92 05/18/2011 1146   CALCIUM 9.3 11/17/2013 1609   CALCIUM 9.8 05/18/2011 1146   PROT 7.0 11/17/2013 1609   PROT 7.1 05/18/2011 1146   ALBUMIN 3.7 11/17/2013 1609   ALBUMIN 4.4 05/18/2011 1146   AST 26 11/17/2013 1609   AST 23 05/18/2011  1146   ALT 31 11/17/2013 1609   ALT 23 05/18/2011 1146   ALKPHOS 59 11/17/2013 1609   ALKPHOS 74 05/18/2011 1146   BILITOT 0.32 11/17/2013 1609   BILITOT 0.6 05/18/2011 1146   GFRNONAA >60 08/24/2010 1343   GFRAA  Value: >60        The eGFR has been calculated using the MDRD equation. This calculation has not been validated in all clinical situations. eGFR's persistently <60 mL/min signify possible Chronic Kidney Disease. 08/24/2010 1343    I No results found for this basename: SPEP,  UPEP,   kappa and lambda light chains    Lab Results  Component Value Date   WBC 6.9 11/17/2013   NEUTROABS 4.1 11/17/2013   HGB 12.8 11/17/2013   HCT 38.7 11/17/2013   MCV 92.8 11/17/2013   PLT 269 11/17/2013      Chemistry      Component Value Date/Time   NA 141 11/17/2013 1609   NA 140 05/18/2011 1146   K 3.8 11/17/2013 1609   K 4.2 05/18/2011 1146   CL 108* 05/14/2012 1042   CL 106 05/18/2011 1146   CO2 26 11/17/2013 1609   CO2 24 05/18/2011 1146   BUN 8.7 11/17/2013 1609   BUN 12 05/18/2011 1146   CREATININE 0.9 11/17/2013 1609   CREATININE 0.92 05/18/2011 1146      Component Value Date/Time   CALCIUM 9.3 11/17/2013 1609   CALCIUM 9.8 05/18/2011 1146   ALKPHOS 59 11/17/2013  1609   ALKPHOS 74 05/18/2011 1146   AST 26 11/17/2013 1609   AST 23 05/18/2011 1146   ALT 31 11/17/2013 1609   ALT 23 05/18/2011 1146   BILITOT 0.32 11/17/2013 1609   BILITOT 0.6 05/18/2011 1146       Lab Results  Component Value Date   LABCA2 21 08/16/2010    No components found with this basename: [id number]    No results found for this basename: INR,  in the last 168 hours  Urinalysis No results found for this basename: colorurine,  appearanceur,  labspec,  phurine,  glucoseu,  hgbur,  bilirubinur,  ketonesur,  proteinur,  urobilinogen,  nitrite,  leukocytesur    STUDIES: Repeat mammography is due. There is no bone density on record  ASSESSMENT: 48 y.o. BRCA negative Heather Schaefer, Heather Schaefer woman status post left lumpectomy and sentinel lymph node sampling 08/29/2010 for a pT1c pN0, stage IA invasive ductal carcinoma, grade 3, estrogen receptor positive, progesterone receptor and HER-2 negative, with an MIB-1 of 97%.  (1) Adjuvant chemotherapy with cyclophosphamide and doxorubicin in dose dense fashion x4 completed 11/21/2010, followed by paclitaxel weekly for 9 of 12 planned doses, interrupted because of neuropathy, completed 02/12/2011  (2) Adjuvant radiation to the left breast completed 05/07/2011  (3) Tamoxifen started February 2013  PLAN: I spent approximately one hour with Heather Schaefer today reviewing her situation from her initial mammography through her chemotherapy treatments, radiation, and antiestrogen therapy. Luckily her peripheral neuropathy has completely resolved and there are no residual symptoms from her earlier treatments. She is also tolerating the tamoxifen well.  She understands that 5 years of tamoxifen is not the current standard of care. 10 years of tamoxifen would be, or alternatively 2 years of tamoxifen followed by 3 years of an aromatase inhibitor.  We discussed the possible toxicities, side effects and complications of both tamoxifen and anastrozole. She has not had a  bone density and she does not meet the NCCN criteria for a menopausal woman, which would  be 1 year without periods but not while being treated with a medication that may suppress.  Nevertheless if she wanted to stop tamoxifen and start anastrozole, we would obtain an [facility] and estradiol level, obtain a baseline bone density, and follow the hormone levels every 3 months for at least the first year.  At this point Heather Schaefer can't quite make up her mind what she would like to do. She is attracted to the fact that while on tamoxifen she may use vaginal estrogens in the form of Vagifem suppositories or Estring, which is something she would not be able to do if she went on anastrozole.  We left it that for now she will continue on tamoxifen, but let me know if she changes her mind. She will see Dr. Brantley Stage late this year. She will see me again a year from now.  Heather Schaefer has a good understanding of the overall plan. She agrees with it. She knows the goal of treatment in her case is cure. She will call with any problems that may develop before her next visit here.  Chauncey Cruel, MD   11/17/2013 5:48 PM

## 2013-11-17 NOTE — Addendum Note (Signed)
Addended by: Amelia Jo I on: 11/17/2013 05:59 PM   Modules accepted: Medications

## 2013-11-24 ENCOUNTER — Telehealth: Payer: Self-pay | Admitting: Oncology

## 2013-11-24 NOTE — Telephone Encounter (Signed)
, °

## 2013-12-02 ENCOUNTER — Telehealth: Payer: Self-pay

## 2013-12-02 NOTE — Telephone Encounter (Signed)
Returned pt call.  LMOVM re 3d mammogram - let pt know that most ins companies are requiring high co-pay or requiring pt to pay.  Pt to all clinic with any questions.

## 2013-12-08 ENCOUNTER — Ambulatory Visit
Admission: RE | Admit: 2013-12-08 | Discharge: 2013-12-08 | Disposition: A | Discharge status: Home / Self Care | Payer: BC Managed Care – PPO | Source: Ambulatory Visit | Attending: Oncology | Admitting: Oncology

## 2013-12-08 DIAGNOSIS — C50919 Malignant neoplasm of unspecified site of unspecified female breast: Secondary | ICD-10-CM

## 2014-04-03 NOTE — Telephone Encounter (Signed)
No entry 

## 2014-04-04 ENCOUNTER — Telehealth: Payer: Self-pay | Admitting: Oncology

## 2014-04-04 NOTE — Telephone Encounter (Signed)
lvm for pt regarding to 8.8 appt moved to 8.15 per MD on Pal....mailed pt appt sched and letter

## 2014-05-14 ENCOUNTER — Other Ambulatory Visit: Payer: BC Managed Care – PPO

## 2014-05-14 ENCOUNTER — Ambulatory Visit: Payer: BC Managed Care – PPO | Admitting: Oncology

## 2014-09-03 ENCOUNTER — Telehealth: Payer: Self-pay

## 2014-09-03 DIAGNOSIS — C50919 Malignant neoplasm of unspecified site of unspecified female breast: Secondary | ICD-10-CM

## 2014-09-03 MED ORDER — TAMOXIFEN CITRATE 20 MG PO TABS: 20.0000 mg | ORAL_TABLET | Freq: Every day | ORAL | Status: DC

## 2014-09-03 NOTE — Telephone Encounter (Signed)
Request for tamoxifen refill from [facility]

## 2014-11-19 ENCOUNTER — Other Ambulatory Visit: Payer: Self-pay | Admitting: *Deleted

## 2014-11-19 DIAGNOSIS — C50319 Malignant neoplasm of lower-inner quadrant of unspecified female breast: Secondary | ICD-10-CM

## 2014-11-22 ENCOUNTER — Other Ambulatory Visit: Payer: BC Managed Care – PPO

## 2014-11-29 ENCOUNTER — Ambulatory Visit: Payer: BC Managed Care – PPO | Admitting: Oncology

## 2014-12-06 ENCOUNTER — Ambulatory Visit: Payer: BC Managed Care – PPO | Admitting: Oncology

## 2014-12-07 ENCOUNTER — Other Ambulatory Visit: Payer: Self-pay | Admitting: Oncology

## 2014-12-07 DIAGNOSIS — Z853 Personal history of malignant neoplasm of breast: Secondary | ICD-10-CM

## 2014-12-07 DIAGNOSIS — Z9889 Other specified postprocedural states: Secondary | ICD-10-CM

## 2014-12-15 ENCOUNTER — Other Ambulatory Visit: Payer: Self-pay | Admitting: Oncology

## 2014-12-15 ENCOUNTER — Ambulatory Visit
Admission: RE | Admit: 2014-12-15 | Discharge: 2014-12-15 | Disposition: A | Discharge status: Home / Self Care | Payer: BLUE CROSS/BLUE SHIELD | Source: Ambulatory Visit | Attending: Oncology | Admitting: Oncology

## 2014-12-15 DIAGNOSIS — Z853 Personal history of malignant neoplasm of breast: Secondary | ICD-10-CM

## 2014-12-15 DIAGNOSIS — Z9889 Other specified postprocedural states: Secondary | ICD-10-CM

## 2014-12-15 DIAGNOSIS — N6310 Unspecified lump in the right breast, unspecified quadrant: Secondary | ICD-10-CM

## 2015-01-27 ENCOUNTER — Other Ambulatory Visit: Payer: Self-pay | Admitting: Oncology

## 2015-01-27 NOTE — Telephone Encounter (Signed)
Chart reviewed.  30 day supply sent.   Pt needs follow up appt

## 2015-02-25 ENCOUNTER — Other Ambulatory Visit: Payer: Self-pay | Admitting: Oncology

## 2015-03-28 ENCOUNTER — Other Ambulatory Visit: Payer: Self-pay | Admitting: Oncology

## 2015-03-29 ENCOUNTER — Other Ambulatory Visit: Payer: Self-pay

## 2015-03-29 DIAGNOSIS — C50319 Malignant neoplasm of lower-inner quadrant of unspecified female breast: Secondary | ICD-10-CM

## 2015-03-29 MED ORDER — TAMOXIFEN CITRATE 20 MG PO TABS: 20.0000 mg | ORAL_TABLET | Freq: Every day | ORAL | Status: DC

## 2015-03-29 NOTE — Progress Notes (Unsigned)
Writer spoke with patient about being non compliant with appts, labs and follow ups.  Stressed how important it was to have labs checked and see the doctor.  Writer told her that enough tamoxifen would be sent to her pharmacy until she had labs and saw [facility] or Dr. Jana Hakim.  Patient has no showed appts and cancelled apts after receiving her tamoxifen.  Patient denies any of this but is arguing over the fact that she "NEEDS" the tamoxifen. Patient named her day and time and appt was scheduled accordingly.  Tamoxifen refill sent to pharmacy for two months.

## 2015-03-30 ENCOUNTER — Telehealth: Payer: Self-pay | Admitting: Oncology

## 2015-03-30 NOTE — Telephone Encounter (Signed)
pt vm full.....mailed pt appt sched and letter °

## 2015-05-03 ENCOUNTER — Encounter: Payer: Self-pay | Admitting: Nurse Practitioner

## 2015-05-03 ENCOUNTER — Other Ambulatory Visit: Payer: Self-pay | Admitting: *Deleted

## 2015-05-03 ENCOUNTER — Telehealth: Payer: Self-pay | Admitting: Oncology

## 2015-05-03 ENCOUNTER — Ambulatory Visit (HOSPITAL_BASED_OUTPATIENT_CLINIC_OR_DEPARTMENT_OTHER): Payer: BLUE CROSS/BLUE SHIELD | Admitting: Nurse Practitioner

## 2015-05-03 ENCOUNTER — Other Ambulatory Visit (HOSPITAL_BASED_OUTPATIENT_CLINIC_OR_DEPARTMENT_OTHER): Payer: BLUE CROSS/BLUE SHIELD

## 2015-05-03 VITALS — BP 102/67 | HR 77 | Temp 98.1°F | Resp 18 | Ht 66.5 in | Wt 164.9 lb

## 2015-05-03 DIAGNOSIS — C50319 Malignant neoplasm of lower-inner quadrant of unspecified female breast: Secondary | ICD-10-CM

## 2015-05-03 DIAGNOSIS — C50312 Malignant neoplasm of lower-inner quadrant of left female breast: Secondary | ICD-10-CM

## 2015-05-03 DIAGNOSIS — Z170 Estrogen receptor positive status [ER+]: Secondary | ICD-10-CM | POA: Diagnosis not present

## 2015-05-03 DIAGNOSIS — Z23 Encounter for immunization: Secondary | ICD-10-CM

## 2015-05-03 LAB — CBC WITH DIFFERENTIAL/PLATELET
BASO%: 0.5 % (ref 0.0–2.0)
Basophils Absolute: 0 10*3/uL (ref 0.0–0.1)
EOS ABS: 0.1 10*3/uL (ref 0.0–0.5)
EOS%: 2.8 % (ref 0.0–7.0)
HCT: 41.1 % (ref 34.8–46.6)
HGB: 13.5 g/dL (ref 11.6–15.9)
LYMPH%: 36.4 % (ref 14.0–49.7)
MCH: 30 pg (ref 25.1–34.0)
MCHC: 32.9 g/dL (ref 31.5–36.0)
MCV: 91.4 fL (ref 79.5–101.0)
MONO#: 0.3 10*3/uL (ref 0.1–0.9)
MONO%: 6 % (ref 0.0–14.0)
NEUT%: 54.3 % (ref 38.4–76.8)
NEUTROS ABS: 2.9 10*3/uL (ref 1.5–6.5)
Platelets: 259 10*3/uL (ref 145–400)
RBC: 4.5 10*6/uL (ref 3.70–5.45)
RDW: 13.7 % (ref 11.2–14.5)
WBC: 5.3 10*3/uL (ref 3.9–10.3)
lymph#: 1.9 10*3/uL (ref 0.9–3.3)

## 2015-05-03 LAB — COMPREHENSIVE METABOLIC PANEL
ALK PHOS: 41 U/L (ref 40–150)
ALT: 14 U/L (ref 0–55)
AST: 16 U/L (ref 5–34)
Albumin: 3.9 g/dL (ref 3.5–5.0)
Anion Gap: 9 mEq/L (ref 3–11)
BILIRUBIN TOTAL: 0.58 mg/dL (ref 0.20–1.20)
BUN: 9.6 mg/dL (ref 7.0–26.0)
CHLORIDE: 106 meq/L (ref 98–109)
CO2: 26 mEq/L (ref 22–29)
CREATININE: 1 mg/dL (ref 0.6–1.1)
Calcium: 9.4 mg/dL (ref 8.4–10.4)
EGFR: 65 mL/min/{1.73_m2} — ABNORMAL LOW (ref >90)
GLUCOSE: 98 mg/dL (ref 70–140)
Potassium: 3.9 mEq/L (ref 3.5–5.1)
SODIUM: 141 meq/L (ref 136–145)
Total Protein: 7 g/dL (ref 6.4–8.3)

## 2015-05-03 MED ORDER — INFLUENZA VAC SPLIT QUAD 0.5 ML IM SUSY
0.5000 mL | PREFILLED_SYRINGE | Freq: Once | INTRAMUSCULAR | Status: AC
  Administered 2015-05-03: 0.5 mL via INTRAMUSCULAR
  Filled 2015-05-03: qty 0.5

## 2015-05-03 MED ORDER — TAMOXIFEN CITRATE 20 MG PO TABS: 20.0000 mg | ORAL_TABLET | Freq: Every day | ORAL | Status: DC

## 2015-05-03 NOTE — Progress Notes (Signed)
[facility]  Telephone:(336) [phone] Fax:(336) [phone]   ID: Heather Schaefer DOB: [birth year]  MR#: [id number]  IPJ#:[id number]  PCP: Heather Awe., MD GYN: SU: Heather Schaefer OTHER MD: Heather Schaefer  CHIEF COMPLAINT: Estrogen receptor positive breast cancer  CURRENT TREATMENT: Tamoxifen  BREAST CANCER HISTORY: The patient had screening mammography at the [facility] Center 06/06/2010 showing a possible mass in the left breast. Left diagnostic mammography and ultrasonography to 06/21/2010 confirmed an oval mass in the lower inner quadrant of the left breast which was not palpable. Ultrasound showed a 0.8 cm hypoechoic smoothly marginated mass. This was biopsied 08/09/2010, and showed Heather Schaefer [id number]) and invasive ductal carcinoma, high-grade, with a brisk lymphocytic reaction. HER-2/neu was not amplified, the signals ratio being 1.11. Estrogen receptor was 56% positive, with weak staining intensity. Progesterone receptor was negative. The proliferation marker was 97%.  On 08/15/2010 the patient underwent bilateral breast MRI, which showed a solitary mass in the left breast measuring 1.3 cm. On 08/29/2010 Heather Schaefer underwent left lumpectomy and sentinel lymph node sampling, the final pathology, [id number], showing a high-grade invasive ductal carcinoma measuring 1.1 cm, with all 3 sentinel lymph nodes benign. Repeat HER-2 was again negative, with a ratio of 1.23.  Her subsequent history is as detailed below  INTERVAL HISTORY: Heather Schaefer returns today for followup of her early stage breast cancer, accompanied by her husband, Heather Schaefer. She has been on tamoxifen since February 2013 and is tolerating this well with no side effects that she is aware of. The interval history is otherwise unremarkable. She has not complaints to offer. She works out daily and is in good health. She is not currently working and has few stressors.  REVIEW OF SYSTEMS: A detailed review of systems today was entirely  negative.   PAST MEDICAL HISTORY: Past Medical History  Diagnosis Date  . Breast cancer   . Glaucoma   . Breast cancer, female 02/23/2011  . Nasal congestion   . Cough   . No pertinent past medical history     PAST SURGICAL HISTORY: Past Surgical History  Procedure Laterality Date  . Breast surgery    . Laparoscopic endometriosis fulguration  2001  . Port-a-cath removal  05/29/2011    Procedure: MINOR REMOVAL PORT-A-CATH;  Surgeon: Heather Faster. Cornett, MD;  Location: [facility];  Service: General;;    FAMILY HISTORY No family history on file. The patient's father died at the age of 75 and a single in [place] plane crash. The patient's mother is living, age 75. The patient has one half brother and one half sister. There is no history of breast or ovarian cancer in the family. The patient did undergo genetic testing, which was negative for BRCA1 or 2 gene mutations  GYNECOLOGIC HISTORY:  Patient's last menstrual period was 08/28/2010. Menarche age 21, first live birth age 67. The patient is GX P1. She status post right salpingo-oophorectomy for endometriosis remotely. 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 Her husband Heather Schaefer is Engineer, maintenance of operations for a carrier transport company. Their 76 year old daughter, Heather Schaefer, is at [employer]. They have 2 dogs and a cat at home. They attend a [employer].    ADVANCED DIRECTIVES: Not in place   HEALTH MAINTENANCE: Social History  Substance  Use Topics  . Smoking status: Former Smoker    Quit date: 05/27/2001  . Smokeless tobacco: Not on file  . Alcohol Use: Yes     Comment: 1-2 week     Colonoscopy:  PAP:  Bone density:  Lipid panel:  Allergies  Allergen Reactions    . Hydrocodone Nausea And Vomiting    Current Outpatient Prescriptions  Medication Sig Dispense Refill  . ADDERALL XR 25 MG 24 hr capsule TK 1 C PO QD  0  . Biotin (BIOTIN 5000) 5 MG CAPS Take 1 capsule by mouth daily.    Marland Kitchen latanoprost (XALATAN) 0.005 % ophthalmic solution Place 1 drop into both eyes at bedtime.   3  . loratadine (CLARITIN) 10 MG tablet Take 10 mg by mouth daily as needed.     . ALPRAZolam (XANAX) 0.5 MG tablet Take 0.5 mg by mouth as needed for anxiety. Reported on 05/03/2015    . ibuprofen (ADVIL,MOTRIN) 200 MG tablet Take 200 mg by mouth every 6 (six) hours as needed. Reported on 05/03/2015    . tamoxifen (NOLVADEX) 20 MG tablet Take 1 tablet (20 mg total) by mouth daily. 90 tablet 3   Current Facility-Administered Medications  Medication Dose Route Frequency Provider Last Rate Last Dose  . Influenza vac split quadrivalent PF (FLUARIX) injection 0.5 mL  0.5 mL Intramuscular Once Heather Panda, NP        OBJECTIVE: Middle-aged white woman in no acute distress Filed Vitals:   05/03/15 1359  BP: 102/67  Pulse: 77  Temp: 98.1 F (36.7 C)  Resp: 18     Body mass index is 26.22 kg/(m^2).    ECOG FS:0 - Asymptomatic  Skin: warm, dry  HEENT: sclerae anicteric, conjunctivae pink, oropharynx clear. No thrush or mucositis.  Lymph Nodes: No cervical or supraclavicular lymphadenopathy  Lungs: clear to auscultation bilaterally, no rales, wheezes, or rhonci  Heart: regular rate and rhythm  Abdomen: round, soft, non tender, positive bowel sounds  Musculoskeletal: No focal spinal tenderness, no peripheral edema  Neuro: non focal, well oriented, positive affect  Breasts: left breast status post lumpectomy and radiation. No evidence of recurrent disease. Left axilla benign. Right breast unremarkable.  LAB RESULTS:  CMP     Component Value Date/Time   NA 141 05/03/2015 1331   NA 140 05/18/2011 1146   K 3.9 05/03/2015 1331   K 4.2 05/18/2011 1146   CL 108* 05/14/2012  1042   CL 106 05/18/2011 1146   CO2 26 05/03/2015 1331   CO2 24 05/18/2011 1146   GLUCOSE 98 05/03/2015 1331   GLUCOSE 103* 05/14/2012 1042   GLUCOSE 86 05/18/2011 1146   BUN 9.6 05/03/2015 1331   BUN 12 05/18/2011 1146   CREATININE 1.0 05/03/2015 1331   CREATININE 0.92 05/18/2011 1146   CALCIUM 9.4 05/03/2015 1331   CALCIUM 9.8 05/18/2011 1146   PROT 7.0 05/03/2015 1331   PROT 7.1 05/18/2011 1146   ALBUMIN 3.9 05/03/2015 1331   ALBUMIN 4.4 05/18/2011 1146   AST 16 05/03/2015 1331   AST 23 05/18/2011 1146   ALT 14 05/03/2015 1331   ALT 23 05/18/2011 1146   ALKPHOS 41 05/03/2015 1331   ALKPHOS 74 05/18/2011 1146   BILITOT 0.58 05/03/2015 1331   BILITOT 0.6 05/18/2011 1146   GFRNONAA >60 08/24/2010 1343   GFRAA  08/24/2010 1343    >60        The eGFR has been calculated using the MDRD equation. This calculation has not  been validated in all clinical situations. eGFR's persistently <60 mL/min signify possible Chronic Kidney Disease.    I No results found for: SPEP  Lab Results  Component Value Date   WBC 5.3 05/03/2015   NEUTROABS 2.9 05/03/2015   HGB 13.5 05/03/2015   HCT 41.1 05/03/2015   MCV 91.4 05/03/2015   PLT 259 05/03/2015      Chemistry      Component Value Date/Time   NA 141 05/03/2015 1331   NA 140 05/18/2011 1146   K 3.9 05/03/2015 1331   K 4.2 05/18/2011 1146   CL 108* 05/14/2012 1042   CL 106 05/18/2011 1146   CO2 26 05/03/2015 1331   CO2 24 05/18/2011 1146   BUN 9.6 05/03/2015 1331   BUN 12 05/18/2011 1146   CREATININE 1.0 05/03/2015 1331   CREATININE 0.92 05/18/2011 1146      Component Value Date/Time   CALCIUM 9.4 05/03/2015 1331   CALCIUM 9.8 05/18/2011 1146   ALKPHOS 41 05/03/2015 1331   ALKPHOS 74 05/18/2011 1146   AST 16 05/03/2015 1331   AST 23 05/18/2011 1146   ALT 14 05/03/2015 1331   ALT 23 05/18/2011 1146   BILITOT 0.58 05/03/2015 1331   BILITOT 0.6 05/18/2011 1146       Lab Results  Component Value Date    LABCA2 21 08/16/2010    No components found for: [id number]  No results for input(s): INR in the last 168 hours.  Urinalysis No results found for: COLORURINE  STUDIES: EXAM: DIGITAL DIAGNOSTIC BILATERAL MAMMOGRAM WITH 3D TOMOSYNTHESIS WITH CAD  ULTRASOUND RIGHT BREAST  COMPARISON: Previous exam(s).  ACR Breast Density Category b: There are scattered areas of fibroglandular density.  FINDINGS: There are stable postsurgical changes within the left breast. There are no dominant masses, suspicious calcifications or secondary signs of malignancy within either breast.  Mammographic images were processed with CAD.  On physical exam, vague areas of thickened breast tissue are palpated within the inner and outer aspects of the right breast, without circumscribed mass.  Targeted ultrasound is performed in the areas of the palpable abnormalities within the inner and outer quadrants of the right breast, as well as within the inferior right breast, as directed by the patient. Only ridges of normal dense fibroglandular tissues are seen throughout, corresponding to the area of palpable abnormalities.  No suspicious solid or cystic masses are identified within the right breast.  IMPRESSION: No mammographic or sonographic evidence of malignancy. Stable postsurgical changes within the left breast.  RECOMMENDATION: Screening mammogram in one year.(Code:SM-B-01Y)  I have discussed the findings and recommendations with the patient. Results were also provided in writing at the conclusion of the visit. If applicable, a reminder letter will be sent to the patient regarding the next appointment.  BI-RADS CATEGORY 2: Benign.   Electronically Signed  By: Franki Cabot M.D.  On: 12/15/2014 16:04  ASSESSMENT: 50 y.o. BRCA negative [place], [place] woman status post left lumpectomy and sentinel lymph node sampling 08/29/2010 for a pT1c pN0, stage IA invasive ductal  carcinoma, grade 3, estrogen receptor positive, progesterone receptor and HER-2 negative, with an MIB-1 of 97%.  (1) Adjuvant chemotherapy with cyclophosphamide and doxorubicin in dose dense fashion x4 completed 11/21/2010, followed by paclitaxel weekly for 9 of 12 planned doses, interrupted because of neuropathy, completed 02/12/2011  (2) Adjuvant radiation to the left breast completed 05/07/2011  (3) Tamoxifen started February 2013  PLAN: Barbarajean is doing well as far as her breast cancer is concerned. She  is now 4.5 years out from her definitive surgery with no evidence of recurrent disease. She is tolerating the tamoxifen well. We discussed her antiestrogen options, and at this point her goal is to continue the tamoxifen for 10 total years. She claims her hormone levels were checked by her gynecologist last year, and she is still perimenopausal, despite not having a period since the start of chemo 4 years ago.   Dianey will be due for a repeat mammogram in August. She will return in 1 year for labs and a follow up visit with Dr. Jana Hakim. She understands and agrees with this plan. She knows the goal of treatment in her case is cure. She has been encouraged to call with any issues that might arise before her next visit here.  Heather Panda, NP   05/03/2015 2:40 PM

## 2015-05-03 NOTE — Telephone Encounter (Signed)
Appointments made and avs printed for pateint °

## 2016-01-09 ENCOUNTER — Other Ambulatory Visit: Payer: Self-pay | Admitting: Oncology

## 2016-01-09 DIAGNOSIS — Z853 Personal history of malignant neoplasm of breast: Secondary | ICD-10-CM

## 2016-01-17 ENCOUNTER — Ambulatory Visit
Admission: RE | Admit: 2016-01-17 | Discharge: 2016-01-17 | Disposition: A | Discharge status: Home / Self Care | Payer: BLUE CROSS/BLUE SHIELD | Source: Ambulatory Visit | Attending: Oncology | Admitting: Oncology

## 2016-01-17 DIAGNOSIS — Z853 Personal history of malignant neoplasm of breast: Secondary | ICD-10-CM

## 2016-04-30 ENCOUNTER — Other Ambulatory Visit: Payer: Self-pay | Admitting: *Deleted

## 2016-04-30 DIAGNOSIS — C50319 Malignant neoplasm of lower-inner quadrant of unspecified female breast: Secondary | ICD-10-CM

## 2016-05-01 ENCOUNTER — Telehealth: Payer: Self-pay | Admitting: Oncology

## 2016-05-01 ENCOUNTER — Other Ambulatory Visit: Payer: BLUE CROSS/BLUE SHIELD

## 2016-05-01 ENCOUNTER — Ambulatory Visit: Payer: BLUE CROSS/BLUE SHIELD | Admitting: Oncology

## 2016-05-01 NOTE — Telephone Encounter (Signed)
PT CALLED TO R/S 1/10 APPT TO FEB. PT HAS NEW APPT DATE/TIME

## 2016-05-16 ENCOUNTER — Encounter: Payer: Self-pay | Admitting: Oncology

## 2016-05-16 ENCOUNTER — Other Ambulatory Visit: Payer: Self-pay | Admitting: Emergency Medicine

## 2016-05-16 MED ORDER — TAMOXIFEN CITRATE 20 MG PO TABS: 20.0000 mg | ORAL_TABLET | Freq: Every day | ORAL | 3 refills | Status: DC

## 2016-06-06 ENCOUNTER — Ambulatory Visit (HOSPITAL_BASED_OUTPATIENT_CLINIC_OR_DEPARTMENT_OTHER): Payer: Self-pay | Admitting: Oncology

## 2016-06-06 ENCOUNTER — Ambulatory Visit (HOSPITAL_BASED_OUTPATIENT_CLINIC_OR_DEPARTMENT_OTHER): Payer: Self-pay

## 2016-06-06 VITALS — BP 116/60 | HR 82 | Temp 97.5°F | Resp 18 | Ht 66.5 in | Wt 169.6 lb

## 2016-06-06 DIAGNOSIS — C50312 Malignant neoplasm of lower-inner quadrant of left female breast: Secondary | ICD-10-CM

## 2016-06-06 DIAGNOSIS — Z171 Estrogen receptor negative status [ER-]: Secondary | ICD-10-CM

## 2016-06-06 DIAGNOSIS — C50319 Malignant neoplasm of lower-inner quadrant of unspecified female breast: Secondary | ICD-10-CM

## 2016-06-06 DIAGNOSIS — Z79810 Long term (current) use of selective estrogen receptor modulators (SERMs): Secondary | ICD-10-CM

## 2016-06-06 LAB — CBC WITH DIFFERENTIAL/PLATELET
BASO%: 0.6 % (ref 0.0–2.0)
Basophils Absolute: 0 10*3/uL (ref 0.0–0.1)
EOS ABS: 0.2 10*3/uL (ref 0.0–0.5)
EOS%: 3.4 % (ref 0.0–7.0)
HCT: 39.7 % (ref 34.8–46.6)
HEMOGLOBIN: 13.4 g/dL (ref 11.6–15.9)
LYMPH%: 37.6 % (ref 14.0–49.7)
MCH: 30.6 pg (ref 25.1–34.0)
MCHC: 33.8 g/dL (ref 31.5–36.0)
MCV: 90.7 fL (ref 79.5–101.0)
MONO#: 0.4 10*3/uL (ref 0.1–0.9)
MONO%: 6.9 % (ref 0.0–14.0)
NEUT%: 51.5 % (ref 38.4–76.8)
NEUTROS ABS: 2.9 10*3/uL (ref 1.5–6.5)
Platelets: 267 10*3/uL (ref 145–400)
RBC: 4.38 10*6/uL (ref 3.70–5.45)
RDW: 13.4 % (ref 11.2–14.5)
WBC: 5.6 10*3/uL (ref 3.9–10.3)
lymph#: 2.1 10*3/uL (ref 0.9–3.3)

## 2016-06-06 LAB — COMPREHENSIVE METABOLIC PANEL
ALBUMIN: 3.8 g/dL (ref 3.5–5.0)
ALK PHOS: 47 U/L (ref 40–150)
ALT: 14 U/L (ref 0–55)
AST: 19 U/L (ref 5–34)
Anion Gap: 9 mEq/L (ref 3–11)
BILIRUBIN TOTAL: 0.43 mg/dL (ref 0.20–1.20)
BUN: 12.1 mg/dL (ref 7.0–26.0)
CO2: 25 mEq/L (ref 22–29)
Calcium: 9.5 mg/dL (ref 8.4–10.4)
Chloride: 108 mEq/L (ref 98–109)
Creatinine: 0.9 mg/dL (ref 0.6–1.1)
EGFR: 72 mL/min/{1.73_m2} — AB (ref >90)
Glucose: 104 mg/dl (ref 70–140)
POTASSIUM: 3.8 meq/L (ref 3.5–5.1)
Sodium: 142 mEq/L (ref 136–145)
TOTAL PROTEIN: 6.9 g/dL (ref 6.4–8.3)

## 2016-06-06 NOTE — Progress Notes (Signed)
[facility]  Telephone:([phone] Fax:(336) [phone]   ID: Heather Schaefer DOB: 06-Sep-1965  MR#: [id number]  ONG#:[id number]  PCP: Jasmine Awe., MD GYN: SU: Thomas Cornett OTHER MD: Thea Silversmith  CHIEF COMPLAINT: Estrogen receptor positive breast cancer  CURRENT TREATMENT: Tamoxifen  BREAST CANCER HISTORY: The patient had screening mammography at the breast Center 06/06/2010 showing a possible mass in the left breast. Left diagnostic mammography and ultrasonography to 06/21/2010 confirmed an oval mass in the lower inner quadrant of the left breast which was not palpable. Ultrasound showed a 0.8 cm hypoechoic smoothly marginated mass. This was biopsied 08/09/2010, and showed [facility] [place] [phone]) and invasive ductal carcinoma, high-grade, with a brisk lymphocytic reaction. HER-2/neu was not amplified, the signals ratio being 1.11. Estrogen receptor was 56% positive, with weak staining intensity. Progesterone receptor was negative. The proliferation marker was 97%.  On 08/15/2010 the patient underwent bilateral breast MRI, which showed a solitary mass in the left breast measuring 1.3 cm. On 08/29/2010 Dorothyann underwent left lumpectomy and sentinel lymph node sampling, the final pathology, [id number], showing a high-grade invasive ductal carcinoma measuring 1.1 cm, with all 3 sentinel lymph nodes benign. Repeat HER-2 was again negative, with a ratio of 1.23.  Her subsequent history is as detailed below  INTERVAL HISTORY: Heather Schaefer returns today for follow-up of her estrogen receptor positive breast cancer. She continues on tamoxifen, with good tolerance. Hot flashes are only occasional. She doesn't have any other symptoms related to this drug that she is aware of and she obtains it at a good price.  She has not had a period since chemotherapy. She wonders if she is menopausal at this point. Of course that might allow [place] to switch her to an aromatase inhibitor so she could complete  her anti-estrogen treatment earlier.  REVIEW OF SYSTEMS: Adalena exercises aggressively and frequently. She does weights and cardio and she tells me she has an excellent diet. Despite this she is gaining weight. A detailed review of systems today was otherwise stable  PAST MEDICAL HISTORY: Past Medical History:  Diagnosis Date  . Breast cancer (Heather Schaefer)   . Breast cancer, female (Heather Schaefer) 02/23/2011  . Cough   . Glaucoma   . Nasal congestion   . No pertinent past medical history     PAST SURGICAL HISTORY: Past Surgical History:  Procedure Laterality Date  . BREAST SURGERY    . LAPAROSCOPIC ENDOMETRIOSIS FULGURATION  2001  . PORT-A-CATH REMOVAL  05/29/2011   Procedure: MINOR REMOVAL PORT-A-CATH;  Surgeon: Joyice Faster. Cornett, MD;  Location: [facility];  Service: General;;    FAMILY HISTORY No family history on file. The patient's father died at the age of 80 and a single in [place] plane crash. The patient's mother is living, age 72. The patient has one half brother and one half sister. There is no history of breast or ovarian cancer in the family. The patient did undergo genetic testing, which was negative for BRCA1 or 2 gene mutations  GYNECOLOGIC HISTORY:  Patient's last menstrual period was 08/28/2010. Menarche age 16, first live birth age 68. The patient is GX P1. She status post right salpingo-oophorectomy for endometriosis remotely. 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 Her husband Heather Schaefer is Engineer, maintenance of operations for  a carrier transport company. Their 40 year old daughter, Heather Schaefer, is at [employer]. They have 2 dogs and a cat at home. They attend a [employer].    ADVANCED DIRECTIVES: Not in place   HEALTH  MAINTENANCE: Social History  Substance Use Topics  . Smoking status: Former Smoker    Quit date: 05/27/2001  . Smokeless tobacco: Not on file  . Alcohol use Yes     Comment: 1-2 week     Colonoscopy:  PAP:  Bone density:  Lipid panel:  Allergies  Allergen Reactions  . Hydrocodone Nausea And Vomiting    Current Outpatient Prescriptions  Medication Sig Dispense Refill  . ADDERALL XR 25 MG 24 hr capsule TK 1 C PO QD  0  . ALPRAZolam (XANAX) 0.5 MG tablet Take 0.5 mg by mouth as needed for anxiety. Reported on 05/03/2015    . Biotin (BIOTIN 5000) 5 MG CAPS Take 1 capsule by mouth daily.    Marland Kitchen ibuprofen (ADVIL,MOTRIN) 200 MG tablet Take 200 mg by mouth every 6 (six) hours as needed. Reported on 05/03/2015    . latanoprost (XALATAN) 0.005 % ophthalmic solution Place 1 drop into both eyes at bedtime.   3  . loratadine (CLARITIN) 10 MG tablet Take 10 mg by mouth daily as needed.     . tamoxifen (NOLVADEX) 20 MG tablet Take 1 tablet (20 mg total) by mouth daily. 90 tablet 3   No current facility-administered medications for this visit.     OBJECTIVE: Middle-aged white woman Who appears well Vitals:   06/06/16 1202  BP: 116/60  Pulse: 82  Resp: 18  Temp: 97.5 F (36.4 C)     Body mass index is 26.96 kg/m.    ECOG FS:0 - Asymptomatic  Sclerae unicteric, pupils round and equal Oropharynx clear and moist-- no thrush or other lesions No cervical or supraclavicular adenopathy Lungs no rales or rhonchi Heart regular rate and rhythm Abd soft, nontender, positive bowel sounds MSK no focal spinal tenderness, no upper extremity lymphedema Neuro: nonfocal, well oriented, appropriate affect Breasts: The right breast is benign. The left breast is status post lumpectomy followed by radiation with no evidence of local recurrence. Both axillae are benign.  LAB RESULTS:  CMP     Component Value Date/Time   NA 142 06/06/2016 1225   K 3.8 06/06/2016 1225   CL 108 (H) 05/14/2012 1042   CO2  25 06/06/2016 1225   GLUCOSE 104 06/06/2016 1225   GLUCOSE 103 (H) 05/14/2012 1042   BUN 12.1 06/06/2016 1225   CREATININE 0.9 06/06/2016 1225   CALCIUM 9.5 06/06/2016 1225   PROT 6.9 06/06/2016 1225   ALBUMIN 3.8 06/06/2016 1225   AST 19 06/06/2016 1225   ALT 14 06/06/2016 1225   ALKPHOS 47 06/06/2016 1225   BILITOT 0.43 06/06/2016 1225   GFRNONAA >60 08/24/2010 1343   GFRAA  08/24/2010 1343    >60        The eGFR has been calculated using the MDRD equation. This calculation has not been validated in all clinical situations. eGFR's persistently <60 mL/min signify possible Chronic Kidney Disease.    I No results found for: SPEP  Lab Results  Component Value Date   WBC 5.6 06/06/2016   NEUTROABS 2.9 06/06/2016   HGB 13.4 06/06/2016   HCT 39.7 06/06/2016   MCV 90.7 06/06/2016   PLT 267 06/06/2016      Chemistry      Component Value Date/Time   NA 142 06/06/2016  1225   K 3.8 06/06/2016 1225   CL 108 (H) 05/14/2012 1042   CO2 25 06/06/2016 1225   BUN 12.1 06/06/2016 1225   CREATININE 0.9 06/06/2016 1225      Component Value Date/Time   CALCIUM 9.5 06/06/2016 1225   ALKPHOS 47 06/06/2016 1225   AST 19 06/06/2016 1225   ALT 14 06/06/2016 1225   BILITOT 0.43 06/06/2016 1225       Lab Results  Component Value Date   LABCA2 21 08/16/2010    No components found for: [id number]  No results for input(s): INR in the last 168 hours.  Urinalysis No results found for: COLORURINE  STUDIES: CLINICAL DATA: 51 year old female with history of left breast cancer post lumpectomy 08/29/2010 followed by radiation therapy.  EXAM: 2D DIGITAL DIAGNOSTIC BILATERAL MAMMOGRAM WITH CAD AND ADJUNCT TOMO  COMPARISON: Previous exam(s).  ACR Breast Density Category c: The breast tissue is heterogeneously dense, which may obscure small masses.  FINDINGS: No suspicious masses or calcifications are seen in either breast. Postsurgical changes are present in the posterior  inferior left breast related to prior lumpectomy. A spot compression magnification MLO view of the lumpectomy site in the left breast was performed. There is no mammographic evidence of locally recurrent malignancy.  Mammographic images were processed with CAD.  IMPRESSION: No mammographic evidence of malignancy in either breast.  RECOMMENDATION: Diagnostic mammogram is suggested in 1 year. (Code:[id number]-B-01Y)  I have discussed the findings and recommendations with the patient. Results were also provided in writing at the conclusion of the visit. If applicable, a reminder letter will be sent to the patient regarding the next appointment.  BI-RADS CATEGORY 2: Benign.   Electronically Signed By: Everlean Alstrom M.D. On: 01/17/2016 14:37   ASSESSMENT: 51 y.o. BRCA negative [place], [place] woman status post left lumpectomy and sentinel lymph node sampling 08/29/2010 for a pT1c pN0, stage IA invasive ductal carcinoma, grade 3, estrogen receptor positive, progesterone receptor and HER-2 negative, with an MIB-1 of 97%.  (1) Adjuvant chemotherapy with cyclophosphamide and doxorubicin in dose dense fashion x4 completed 11/21/2010, followed by paclitaxel weekly for 9 of 12 planned doses, interrupted because of neuropathy, completed 02/12/2011  (2) Adjuvant radiation to the left breast completed 05/07/2011  (3) Tamoxifen started February 2013  PLAN: Destinee is now 5-1/2 years out from definitive surgery for her breast cancer with no evidence of disease recurrence. This is very favorable.  She tolerates tamoxifen well and the plan at this point is to continue to a total of 10 years.  However if she becomes clearly postmenopausal, we could switch her to an aromatase inhibitor and then she would only need anti-estrogens an additional 2 years. . Accordingly I added an FSH and estradiol level to today's labs.  We discussed the fact that weight gain is one of the common symptoms of menopause, as  are mood changes, irritability, insomnia, and hot flashes. If she wants to maintain her current weight and not gain any further weight she will have to significantly cut back on the carbohydrates  Otherwise she will see me again in one year. She knows to call for any problems that may develop before her next visit here.  Chauncey Cruel, MD   06/06/2016 6:22 PM

## 2016-06-07 LAB — FOLLICLE STIMULATING HORMONE: FSH: 16.4 m[IU]/mL

## 2016-06-11 LAB — ESTRADIOL, ULTRA SENS: Estradiol, Sensitive: 2.6 pg/mL

## 2016-07-18 ENCOUNTER — Encounter: Payer: Self-pay | Admitting: Oncology

## 2016-09-26 ENCOUNTER — Encounter: Payer: Self-pay | Admitting: Oncology

## 2016-12-04 ENCOUNTER — Encounter: Payer: Self-pay | Admitting: Oncology

## 2017-01-25 ENCOUNTER — Other Ambulatory Visit: Payer: Self-pay | Admitting: Oncology

## 2017-01-25 DIAGNOSIS — Z853 Personal history of malignant neoplasm of breast: Secondary | ICD-10-CM

## 2017-01-30 ENCOUNTER — Ambulatory Visit
Admission: RE | Admit: 2017-01-30 | Discharge: 2017-01-30 | Disposition: A | Discharge status: Home / Self Care | Payer: 59 | Source: Ambulatory Visit | Attending: Oncology | Admitting: Oncology

## 2017-01-30 DIAGNOSIS — Z853 Personal history of malignant neoplasm of breast: Secondary | ICD-10-CM

## 2017-01-30 HISTORY — DX: Personal history of irradiation: Z92.3

## 2017-01-30 HISTORY — DX: Personal history of antineoplastic chemotherapy: Z92.21

## 2017-05-17 ENCOUNTER — Telehealth: Payer: Self-pay | Admitting: *Deleted

## 2017-05-17 ENCOUNTER — Other Ambulatory Visit: Payer: Self-pay | Admitting: *Deleted

## 2017-05-17 DIAGNOSIS — N63 Unspecified lump in unspecified breast: Secondary | ICD-10-CM

## 2017-05-17 NOTE — Telephone Encounter (Signed)
Pt called with c/o right breast mass that is new while doing her breast exams. Pt with known left breast cancer. Orders placed for mammo/US/bx. Pt scheduled to see Dr. Jana Hakim on 2/12

## 2017-05-21 ENCOUNTER — Ambulatory Visit
Admission: RE | Admit: 2017-05-21 | Discharge: 2017-05-21 | Disposition: A | Discharge status: Home / Self Care | Payer: 59 | Source: Ambulatory Visit | Attending: Oncology | Admitting: Oncology

## 2017-05-21 DIAGNOSIS — N63 Unspecified lump in unspecified breast: Secondary | ICD-10-CM

## 2017-05-22 ENCOUNTER — Other Ambulatory Visit: Payer: 59

## 2017-06-03 NOTE — Progress Notes (Signed)
no show

## 2017-06-04 ENCOUNTER — Other Ambulatory Visit: Payer: Self-pay

## 2017-06-04 ENCOUNTER — Telehealth: Payer: Self-pay | Admitting: Oncology

## 2017-06-04 ENCOUNTER — Ambulatory Visit (HOSPITAL_BASED_OUTPATIENT_CLINIC_OR_DEPARTMENT_OTHER): Payer: Self-pay | Admitting: Oncology

## 2017-06-04 ENCOUNTER — Encounter: Payer: Self-pay | Admitting: Oncology

## 2017-06-04 DIAGNOSIS — C50312 Malignant neoplasm of lower-inner quadrant of left female breast: Secondary | ICD-10-CM

## 2017-06-04 DIAGNOSIS — Z171 Estrogen receptor negative status [ER-]: Secondary | ICD-10-CM

## 2017-06-04 NOTE — Telephone Encounter (Signed)
Tried to call patient.

## 2017-06-12 ENCOUNTER — Other Ambulatory Visit: Payer: Self-pay | Admitting: Oncology

## 2017-07-02 ENCOUNTER — Encounter: Payer: Self-pay | Admitting: Genetics

## 2017-09-19 ENCOUNTER — Other Ambulatory Visit: Payer: Self-pay | Admitting: Oncology

## 2017-12-18 ENCOUNTER — Other Ambulatory Visit: Payer: Self-pay | Admitting: Oncology

## 2017-12-28 ENCOUNTER — Other Ambulatory Visit: Payer: Self-pay | Admitting: Oncology

## 2017-12-31 ENCOUNTER — Other Ambulatory Visit: Payer: Self-pay | Admitting: Oncology

## 2018-01-01 ENCOUNTER — Encounter: Payer: Self-pay | Admitting: Oncology

## 2018-01-01 MED ORDER — TAMOXIFEN CITRATE 20 MG PO TABS: 20.0000 mg | ORAL_TABLET | Freq: Every day | ORAL | 0 refills | Status: DC

## 2018-02-03 ENCOUNTER — Other Ambulatory Visit: Payer: Self-pay | Admitting: Oncology

## 2018-02-03 DIAGNOSIS — Z1231 Encounter for screening mammogram for malignant neoplasm of breast: Secondary | ICD-10-CM

## 2018-03-11 ENCOUNTER — Ambulatory Visit
Admission: RE | Admit: 2018-03-11 | Discharge: 2018-03-11 | Disposition: A | Discharge status: Home / Self Care | Payer: 59 | Source: Ambulatory Visit | Attending: Oncology | Admitting: Oncology

## 2018-03-11 DIAGNOSIS — Z1231 Encounter for screening mammogram for malignant neoplasm of breast: Secondary | ICD-10-CM

## 2018-04-07 ENCOUNTER — Other Ambulatory Visit: Payer: Self-pay | Admitting: Oncology

## 2018-05-14 ENCOUNTER — Telehealth: Payer: Self-pay | Admitting: *Deleted

## 2018-05-14 ENCOUNTER — Other Ambulatory Visit: Payer: Self-pay | Admitting: Oncology

## 2018-05-14 NOTE — Telephone Encounter (Signed)
Tamoxifen refill received from [place], [place], [place] through [facility].  Patient started Tamoxifen February 2013.    Requested return call from Wilmetta Speiser Sendejo([phone]) in reference to F/U appointment and menopause status.  Tamoxifen 30-day supply authorized.  "No Show" for F/U on 06-11-2017 F/U.  Scheduling message sent.  Awaiting return call from [name].

## 2018-05-15 ENCOUNTER — Telehealth: Payer: Self-pay | Admitting: Oncology

## 2018-05-15 NOTE — Telephone Encounter (Signed)
Called patient per 1/22 sch message - r/s appt and left message with appt date and time

## 2018-05-28 NOTE — Telephone Encounter (Signed)
Returned call to [name] who confirms receipt of appointment message.  Answered question to verified date and time thanking this nurse or the call.

## 2018-06-05 ENCOUNTER — Inpatient Hospital Stay: Payer: 59

## 2018-06-05 ENCOUNTER — Inpatient Hospital Stay: Payer: 59 | Admitting: Oncology

## 2018-06-08 ENCOUNTER — Other Ambulatory Visit: Payer: Self-pay | Admitting: Oncology

## 2018-06-17 ENCOUNTER — Inpatient Hospital Stay: Payer: 59 | Admitting: Adult Health

## 2018-06-17 ENCOUNTER — Encounter: Payer: Self-pay | Admitting: Adult Health

## 2018-06-17 ENCOUNTER — Inpatient Hospital Stay: Payer: 59 | Attending: Obstetrics and Gynecology

## 2018-06-17 ENCOUNTER — Telehealth: Payer: Self-pay | Admitting: Adult Health

## 2018-06-17 NOTE — Telephone Encounter (Signed)
Per 2/25 sch message - left message for patient to call back if r/s needed.

## 2018-06-27 ENCOUNTER — Telehealth: Payer: Self-pay | Admitting: Adult Health

## 2018-06-27 NOTE — Telephone Encounter (Signed)
Tried to reach regarding 3/23

## 2018-07-09 ENCOUNTER — Other Ambulatory Visit: Payer: Self-pay | Admitting: Oncology

## 2018-07-14 ENCOUNTER — Inpatient Hospital Stay: Payer: Self-pay | Attending: Obstetrics and Gynecology

## 2018-07-14 ENCOUNTER — Inpatient Hospital Stay: Payer: Self-pay | Admitting: Adult Health

## 2018-07-14 NOTE — Progress Notes (Deleted)
[place]  Telephone:(336) [phone] Fax:(336) [phone]   ID: RAI SINAGRA DOB: 08/06/1965  MR#: [id number]  MAU#:[id number]  PCP: Jasmine Awe., MD GYN: SU: Thomas Cornett OTHER MD: Thea Silversmith  CHIEF COMPLAINT: Estrogen receptor positive breast cancer  CURRENT TREATMENT: Tamoxifen  BREAST CANCER HISTORY: The patient had screening mammography at the breast Center 06/06/2010 showing a possible mass in the left breast. Left diagnostic mammography and ultrasonography to 06/21/2010 confirmed an oval mass in the lower inner quadrant of the left breast which was not palpable. Ultrasound showed a 0.8 cm hypoechoic smoothly marginated mass. This was biopsied 08/09/2010, and showed [facility] [id number]) and invasive ductal carcinoma, high-grade, with a brisk lymphocytic reaction. HER-2/neu was not amplified, the signals ratio being 1.11. Estrogen receptor was 56% positive, with weak staining intensity. Progesterone receptor was negative. The proliferation marker was 97%.  On 08/15/2010 the patient underwent bilateral breast MRI, which showed a solitary mass in the left breast measuring 1.3 cm. On 08/29/2010 [place] underwent left lumpectomy and sentinel lymph node sampling, the final pathology, [id number], showing a high-grade invasive ductal carcinoma measuring 1.1 cm, with all 3 sentinel lymph nodes benign. Repeat HER-2 was again negative, with a ratio of 1.23.  Her subsequent history is as detailed below  INTERVAL HISTORY: [name] returns today for followup of her early stage breast cancer, estrogen positive.  She continues on Tamoxifen daily with good tolerance.    Since her last visit she underwent bilateral screening mammogram with tomo that revealed no mammographic evidence of malignancy and breast density category C.  Screening mammogram was recommended in one year.  REVIEW OF SYSTEMS:   PAST MEDICAL HISTORY: Past Medical History:  Diagnosis Date  . Breast cancer ([place])  2012   left breast  . Breast cancer, female ([place]) 02/23/2011  . Cough   . Glaucoma   . Nasal congestion   . No pertinent past medical history   . Personal history of chemotherapy 53  . Personal history of radiation therapy 53    PAST SURGICAL HISTORY: Past Surgical History:  Procedure Laterality Date  . BREAST LUMPECTOMY Left 2012  . BREAST SURGERY    . LAPAROSCOPIC ENDOMETRIOSIS FULGURATION  2001  . PORT-A-CATH REMOVAL  05/29/2011   Procedure: MINOR REMOVAL PORT-A-CATH;  Surgeon: Joyice Faster. Cornett, MD;  Location: [facility];  Service: General;;    FAMILY HISTORY No family history on file. The patient's father died at the age of 19 and a single in [place] plane crash. The patient's mother is living, age 12. The patient has one half brother and one half sister. There is no history of breast or ovarian cancer in the family. The patient did undergo genetic testing, which was negative for BRCA1 or 2 gene mutations  GYNECOLOGIC HISTORY:  Patient's last menstrual period was 08/28/2010. Menarche age 12, first live birth age 53. The patient is GX P1. She status post right salpingo-oophorectomy for endometriosis remotely. 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 Her husband Clair Gulling is Engineer, maintenance of operations for a carrier transport company. Their 37 year old daughter, [name], is at [employer]. They have 2 dogs and a cat at home. They attend a [employer].    ADVANCED DIRECTIVES: Not in place  HEALTH MAINTENANCE: Social History   Tobacco Use  . Smoking status: Former Smoker    Last attempt to quit: 05/27/2001    Years since quitting: 17.1  Substance Use Topics  . Alcohol use: Yes    Comment: 1-2 week  . Drug use: No      Colonoscopy:  PAP:  Bone density:  Lipid panel:  Allergies  Allergen Reactions  . Hydrocodone Nausea And Vomiting    Current Outpatient Medications  Medication Sig Dispense Refill  . ADDERALL XR 25 MG 24 hr capsule TK 1 C PO QD  0  . ALPRAZolam (XANAX) 0.5 MG tablet Take 0.5 mg by mouth as needed for anxiety. Reported on 05/03/2015    . Biotin (BIOTIN 5000) 5 MG CAPS Take 1 capsule by mouth daily.    Marland Kitchen ibuprofen (ADVIL,MOTRIN) 200 MG tablet Take 200 mg by mouth every 6 (six) hours as needed. Reported on 05/03/2015    . latanoprost (XALATAN) 0.005 % ophthalmic solution Place 1 drop into both eyes at bedtime.   3  . loratadine (CLARITIN) 10 MG tablet Take 10 mg by mouth daily as needed.     . tamoxifen (NOLVADEX) 20 MG tablet TAKE 1 TABLET(20 MG) BY MOUTH DAILY 90 tablet 4   No current facility-administered medications for this visit.     OBJECTIVE:  There were no vitals filed for this visit.   There is no height or weight on file to calculate BMI.    ECOG FS:0 - Asymptomatic  GENERAL: Patient is a well appearing female in no acute distress HEENT:  Sclerae anicteric.  Oropharynx clear and moist. No ulcerations or evidence of oropharyngeal candidiasis. Neck is supple.  NODES:  No cervical, supraclavicular, or axillary lymphadenopathy palpated.  BREAST EXAM:  Deferred. LUNGS:  Clear to auscultation bilaterally.  No wheezes or rhonchi. HEART:  Regular rate and rhythm. No murmur appreciated. ABDOMEN:  Soft, nontender.  Positive, normoactive bowel sounds. No organomegaly palpated. MSK:  No focal spinal tenderness to palpation. Full range of motion bilaterally in the upper extremities. EXTREMITIES:  No peripheral edema.   SKIN:  Clear with no obvious rashes or skin changes. No nail dyscrasia. NEURO:  Nonfocal. Well oriented.  Appropriate affect.    LAB RESULTS:  CMP     Component Value Date/Time   NA 142 06/06/2016 1225   K 3.8 06/06/2016 1225   CL 108 (H) 05/14/2012 1042    CO2 25 06/06/2016 1225   GLUCOSE 104 06/06/2016 1225   GLUCOSE 103 (H) 05/14/2012 1042   BUN 12.1 06/06/2016 1225   CREATININE 0.9 06/06/2016 1225   CALCIUM 9.5 06/06/2016 1225   PROT 6.9 06/06/2016 1225   ALBUMIN 3.8 06/06/2016 1225   AST 19 06/06/2016 1225   ALT 14 06/06/2016 1225   ALKPHOS 47 06/06/2016 1225   BILITOT 0.43 06/06/2016 1225   GFRNONAA >60 08/24/2010 1343   GFRAA  08/24/2010 1343    >60        The eGFR has been calculated using the MDRD equation. This calculation has not been validated in all clinical situations. eGFR's persistently <60 mL/min signify possible Chronic Kidney Disease.    I No results found for: SPEP  Lab Results  Component Value Date   WBC 5.6 06/06/2016   NEUTROABS 2.9 06/06/2016   HGB 13.4 06/06/2016   HCT 39.7 06/06/2016   MCV 90.7 06/06/2016   PLT 267 06/06/2016      Chemistry      Component Value Date/Time  NA 142 06/06/2016 1225   K 3.8 06/06/2016 1225   CL 108 (H) 05/14/2012 1042   CO2 25 06/06/2016 1225   BUN 12.1 06/06/2016 1225   CREATININE 0.9 06/06/2016 1225      Component Value Date/Time   CALCIUM 9.5 06/06/2016 1225   ALKPHOS 47 06/06/2016 1225   AST 19 06/06/2016 1225   ALT 14 06/06/2016 1225   BILITOT 0.43 06/06/2016 1225       Lab Results  Component Value Date   LABCA2 21 08/16/2010    No components found for: [id number]  No results for input(s): INR in the last 168 hours.  Urinalysis No results found for: COLORURINE  STUDIES:    ASSESSMENT: 53 y.o. BRCA negative [place], [place] woman status post left lumpectomy and sentinel lymph node sampling 08/29/2010 for a pT1c pN0, stage IA invasive ductal carcinoma, grade 3, estrogen receptor positive, progesterone receptor and HER-2 negative, with an MIB-1 of 97%.  (1) Adjuvant chemotherapy with cyclophosphamide and doxorubicin in dose dense fashion x4 completed 11/21/2010, followed by paclitaxel weekly for 9 of 12 planned doses, interrupted because of  neuropathy, completed 02/12/2011  (2) Adjuvant radiation to the left breast completed 05/07/2011  (3) Tamoxifen started February 2013  PLAN: Shoni is   Wilber Bihari, NP   07/14/2018 10:57 AM

## 2018-07-15 ENCOUNTER — Telehealth: Payer: Self-pay | Admitting: Oncology

## 2018-07-15 NOTE — Telephone Encounter (Signed)
Called patient per 3/20 sch message - unable to reach patient . Left message for patient to call back to r/s

## 2019-02-06 ENCOUNTER — Telehealth: Payer: Self-pay | Admitting: Oncology

## 2019-02-06 NOTE — Telephone Encounter (Signed)
Returned patient's phone call regarding rescheduling an appointment, informed patient I will give her a call back once I get in touch with the provider.

## 2019-02-10 ENCOUNTER — Other Ambulatory Visit: Payer: Self-pay

## 2019-02-10 ENCOUNTER — Telehealth: Payer: Self-pay

## 2019-02-10 DIAGNOSIS — C50312 Malignant neoplasm of lower-inner quadrant of left female breast: Secondary | ICD-10-CM

## 2019-02-10 DIAGNOSIS — Z171 Estrogen receptor negative status [ER-]: Secondary | ICD-10-CM

## 2019-02-10 NOTE — Telephone Encounter (Signed)
Patient calling to request an order faxed over for mammogram to [facility].  Pt due for yearly screening mammogram.    Pt requesting follow up with Dr. Jana Hakim as well.  MD ok for virtual appointment due to patient's current residence and pandemic.  Pt scheduled for a virtual MyChart visit on 11/25.  Order successfully faxed to [facility] [phone].

## 2019-03-17 ENCOUNTER — Telehealth: Payer: Self-pay | Admitting: Oncology

## 2019-03-17 NOTE — Telephone Encounter (Signed)
Contacted patient to verify mychart visit for pre reg 

## 2019-03-17 NOTE — Progress Notes (Signed)
[place]  Telephone:(336) [phone] Fax:(336) [phone]   ID: Heather Schaefer DOB: [birth year]  MR#: [id number]  ZMO#:[id number]  Patient Care Team: Jasmine Awe., MD as PCP - General (Obstetrics and Gynecology) Magrinat, Virgie Dad, MD as Consulting Physician (Oncology) OTHER MD:    I connected with [name] on 03/18/19 at  2:00 PM EST by telephone visit and verified that I am speaking with the correct person using two identifiers.   I discussed the limitations, risks, security and privacy concerns of performing an evaluation and management service by telemedicine and the availability of in-person appointments. I also discussed with the patient that there may be a patient responsible charge related to this service. The patient expressed understanding and agreed to proceed.   Other persons participating in the visit and their role in the encounter: None  Patient's location: Home Provider's location: [place]: Estrogen receptor positive breast cancer  CURRENT TREATMENT: Tamoxifen   INTERVAL HISTORY: Heather Schaefer was contacted today for follow-up of her estrogen receptor positive breast cancer.  She is now living permanently in [place] although she has not yet established herself with either primary care or an oncologist over there.  She continues on tamoxifen.  She is not having any periods but does not feel menopausal, with minimal to no hot flashes.  She believes that she had some labs drawn last year by her oncologist in [place] which indicated she was still perimenopausal.  Vaginal wetness is not a major issue.  Since her last visit, she underwent bilateral screening mammography with tomography at [facility] in [place], [place] on 02/24/2019 showing: breast density category C; no evidence of malignancy in either breast.    REVIEW OF SYSTEMS: Heather Schaefer is doing quite a bit of exercise, walking, cycling, doing  weights, and even running.  She denies any unusual symptoms and in fact feels the moved to [place] has made her and the whole family much healthier.  A detailed review of systems today was otherwise noncontributory   BREAST CANCER HISTORY: From the original intake note:  The patient had screening mammography at the breast Center 06/06/2010 showing a possible mass in the left breast. Left diagnostic mammography and ultrasonography to 06/21/2010 confirmed an oval mass in the lower inner quadrant of the left breast which was not palpable. Ultrasound showed a 0.8 cm hypoechoic smoothly marginated mass. This was biopsied 08/09/2010, and showed [facility] [id number]) and invasive ductal carcinoma, high-grade, with a brisk lymphocytic reaction. HER-2/neu was not amplified, the signals ratio being 1.11. Estrogen receptor was 56% positive, with weak staining intensity. Progesterone receptor was negative. The proliferation marker was 97%.  On 08/15/2010 the patient underwent bilateral breast MRI, which showed a solitary mass in the left breast measuring 1.3 cm. On 08/29/2010 [place] underwent left lumpectomy and sentinel lymph node sampling, the final pathology, [id number], showing a high-grade invasive ductal carcinoma measuring 1.1 cm, with all 3 sentinel lymph nodes benign. Repeat HER-2 was again negative, with a ratio of 1.23.  Her subsequent history is as detailed below   PAST MEDICAL HISTORY: Past Medical History:  Diagnosis Date  . Breast cancer ([place]) 2012   left breast  . Breast cancer, female ([place]) 02/23/2011  . Cough   . Glaucoma   . Nasal congestion   . No pertinent past medical history   . Personal history of chemotherapy 2012  . Personal history of radiation therapy 2012    PAST SURGICAL HISTORY:  Past Surgical History:  Procedure Laterality Date  . BREAST LUMPECTOMY Left 2012  . BREAST SURGERY    . LAPAROSCOPIC ENDOMETRIOSIS FULGURATION  2001  . PORT-A-CATH REMOVAL  05/29/2011   Procedure:  MINOR REMOVAL PORT-A-CATH;  Surgeon: Joyice Faster. Cornett, MD;  Location: [facility];  Service: General;;    FAMILY HISTORY No family history on file. The patient's father died at the age of 76 and a single engine plane crash. The patient's mother is living, age 23. The patient has one half brother and one half sister. There is no history of breast or ovarian cancer in the family. The patient did undergo genetic testing, which was negative for BRCA 1 or 2 gene mutations   GYNECOLOGIC HISTORY:  Patient's last menstrual period was 08/28/2010. Menarche age 80, first live birth age 52. The patient is GX P1. She status post right salpingo-oophorectomy for endometriosis remotely. 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  They now have moved to [place].  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  Their 57 year old daughter, Heather Schaefer, currently lives in [place], [place] for a job.  The patient attends a [employer].    ADVANCED DIRECTIVES: In the absence of any documents to the contrary the patient's husband is her healthcare power of attorney   HEALTH MAINTENANCE: Social History   Tobacco Use  . Smoking status: Former Smoker    Quit date: 05/27/2001    Years since quitting: 17.8  Substance Use Topics  . Alcohol use: Yes    Comment: 1-2 week  . Drug use: No      Allergies  Allergen Reactions  . Hydrocodone Nausea And Vomiting    Current Outpatient Medications  Medication Sig Dispense Refill  . ADDERALL XR 25 MG 24 hr capsule TK 1 C PO QD  0  . ALPRAZolam (XANAX) 0.5 MG tablet Take 0.5 mg by mouth as needed for  anxiety. Reported on 05/03/2015    . Biotin (BIOTIN 5000) 5 MG CAPS Take 1 capsule by mouth daily.    Marland Kitchen ibuprofen (ADVIL,MOTRIN) 200 MG tablet Take 200 mg by mouth every 6 (six) hours as needed. Reported on 05/03/2015    . latanoprost (XALATAN) 0.005 % ophthalmic solution Place 1 drop into both eyes at bedtime.   3  . loratadine (CLARITIN) 10 MG tablet Take 10 mg by mouth daily as needed.     . tamoxifen (NOLVADEX) 20 MG tablet TAKE 1 TABLET(20 MG) BY MOUTH DAILY 90 tablet 4   No current facility-administered medications for this visit.     OBJECTIVE: Middle-aged white woman in no acute distress There were no vitals filed for this visit.   There is no height or weight on file to calculate BMI.      Televisit  LAB RESULTS:  CMP     Component Value Date/Time   NA 142 06/06/2016 1225   K 3.8 06/06/2016 1225   CL 108 (H) 05/14/2012 1042   CO2 25 06/06/2016 1225   GLUCOSE 104 06/06/2016 1225   GLUCOSE 103 (H) 05/14/2012 1042   BUN 12.1 06/06/2016 1225   CREATININE  0.9 06/06/2016 1225   CALCIUM 9.5 06/06/2016 1225   PROT 6.9 06/06/2016 1225   ALBUMIN 3.8 06/06/2016 1225   AST 19 06/06/2016 1225   ALT 14 06/06/2016 1225   ALKPHOS 47 06/06/2016 1225   BILITOT 0.43 06/06/2016 1225   GFRNONAA >60 08/24/2010 1343   GFRAA  08/24/2010 1343    >60        The eGFR has been calculated using the MDRD equation. This calculation has not been validated in all clinical situations. eGFR's persistently <60 mL/min signify possible Chronic Kidney Disease.    I No results found for: SPEP  Lab Results  Component Value Date   WBC 5.6 06/06/2016   NEUTROABS 2.9 06/06/2016   HGB 13.4 06/06/2016   HCT 39.7 06/06/2016   MCV 90.7 06/06/2016   PLT 267 06/06/2016      Chemistry      Component Value Date/Time   NA 142 06/06/2016 1225   K 3.8 06/06/2016 1225   CL 108 (H) 05/14/2012 1042   CO2 25 06/06/2016 1225   BUN 12.1 06/06/2016 1225   CREATININE 0.9 06/06/2016 1225      Component  Value Date/Time   CALCIUM 9.5 06/06/2016 1225   ALKPHOS 47 06/06/2016 1225   AST 19 06/06/2016 1225   ALT 14 06/06/2016 1225   BILITOT 0.43 06/06/2016 1225       Lab Results  Component Value Date   LABCA2 21 08/16/2010    No components found for: [id number]  No results for input(s): INR in the last 168 hours.  Urinalysis No results found for: COLORURINE   STUDIES: Mammography results reviewed with the patient  ASSESSMENT: 53 y.o. BRCA negative [place], [place] woman status post left lumpectomy and sentinel lymph node sampling 08/29/2010 for a pT1c pN0, stage IA invasive ductal carcinoma, grade 3, estrogen receptor positive, progesterone receptor and HER-2 negative, with an MIB-1 of 97%.  (1) Adjuvant chemotherapy with cyclophosphamide and doxorubicin in dose dense fashion x4 completed 11/21/2010, followed by paclitaxel weekly for 9 of 12 planned doses, interrupted because of neuropathy, completed 02/12/2011  (2) Adjuvant radiation to the left breast completed 05/07/2011  (3) Tamoxifen started February 2013  PLAN: Ameliarose is now 7-1/2 years out from definitive surgery for her breast cancer with no evidence of disease recurrence.  This is very favorable.  She is tolerating tamoxifen well and the plan is to continue that a total of 10 years.  She is now residing in [place].  She does have family in this area and sometimes she will visit here, although not currently because of the pandemic.  Nevertheless I think it would be most appropriate for her to establish herself with a local oncologist and a local primary care physician and she tells me she will be doing this.  In the meantime I have refilled her tamoxifen for the next year.  Tentatively I am making her a virtual visit for a year from now  She knows to call for any other issues that may develop before then.   Chauncey Cruel, MD   03/18/2019 2:06 PM  Oncology and Hematology [facility] Tel. [phone]  [place] Igo [phone]   I, Wilburn Mylar, am acting as scribe for Dr. Virgie Dad. Magrinat.  I, Lurline Del MD, have reviewed the above documentation for accuracy and completeness, and I agree with the above.

## 2019-03-18 ENCOUNTER — Inpatient Hospital Stay: Payer: BLUE CROSS/BLUE SHIELD | Attending: Oncology | Admitting: Oncology

## 2019-03-18 DIAGNOSIS — Z171 Estrogen receptor negative status [ER-]: Secondary | ICD-10-CM | POA: Diagnosis not present

## 2019-03-18 DIAGNOSIS — C50312 Malignant neoplasm of lower-inner quadrant of left female breast: Secondary | ICD-10-CM

## 2019-03-18 MED ORDER — TAMOXIFEN CITRATE 20 MG PO TABS: 20.0000 mg | ORAL_TABLET | Freq: Every day | ORAL | 4 refills | Status: DC

## 2019-03-20 ENCOUNTER — Telehealth: Payer: Self-pay | Admitting: Oncology

## 2019-03-20 NOTE — Telephone Encounter (Signed)
I talk with patient regarding schedule  

## 2019-03-20 NOTE — Telephone Encounter (Signed)
RN returned call, voicemail left for return call.  

## 2019-08-18 ENCOUNTER — Other Ambulatory Visit: Payer: Self-pay | Admitting: Oncology

## 2019-10-07 LAB — COLOGUARD: COLOGUARD: NEGATIVE

## 2019-10-10 ENCOUNTER — Encounter: Payer: Self-pay | Admitting: Oncology

## 2019-11-01 ENCOUNTER — Other Ambulatory Visit: Payer: Self-pay | Admitting: Oncology

## 2019-12-04 ENCOUNTER — Encounter: Payer: Self-pay | Admitting: Genetic Counselor

## 2020-02-03 ENCOUNTER — Telehealth: Payer: Self-pay | Admitting: Oncology

## 2020-02-03 NOTE — Telephone Encounter (Signed)
Contacted patient to verify mychart video visit for pre reg 

## 2020-02-04 ENCOUNTER — Inpatient Hospital Stay: Payer: BLUE CROSS/BLUE SHIELD | Attending: Oncology | Admitting: Oncology

## 2020-02-04 DIAGNOSIS — C50312 Malignant neoplasm of lower-inner quadrant of left female breast: Secondary | ICD-10-CM | POA: Diagnosis not present

## 2020-02-04 DIAGNOSIS — Z171 Estrogen receptor negative status [ER-]: Secondary | ICD-10-CM | POA: Diagnosis not present

## 2020-02-04 MED ORDER — ESTRING 2 MG VA RING: 2.0000 mg | VAGINAL_RING | VAGINAL | 12 refills | Status: DC

## 2020-02-04 MED ORDER — TAMOXIFEN CITRATE 20 MG PO TABS: ORAL_TABLET | ORAL | 4 refills | Status: DC

## 2020-02-04 NOTE — Progress Notes (Signed)
Heather Schaefer  [phone] Fax:(336) [phone]   ID: RAGEN LAVER DOB: 09-08-1965  MR#: [id number]  XMI#:[id number]  Patient Care Team: Heather Schaefer Heather Dad, MD as Consulting Physician (Oncology) OTHER MD: Heather Murphy, PA, [facility] [address] [place] [address]  I connected with Heather Schaefer on 02/04/20 at  2:30 PM EDT by video enabled telemedicine visit and verified that I am speaking with the correct person using two identifiers.   I discussed the limitations, risks, security and privacy concerns of performing an evaluation and management service by telemedicine and the availability of in-person appointments. I also discussed with the patient that there may be a patient responsible charge related to this service. The patient expressed understanding and agreed to proceed.   Other persons participating in the visit and their role in the encounter: Husband Heather Schaefer (was driving the car)  Patient's location: Home Provider's location: [place]: Estrogen receptor positive breast cancer  CURRENT TREATMENT: Tamoxifen   INTERVAL HISTORY: Heather Schaefer was contacted today for follow-up of her estrogen receptor positive breast cancer.    She continues on tamoxifen.  She does not have hot flashes although she feels warm at times.  Recall she stopped having periods at the time of her chemotherapy.  REVIEW OF SYSTEMS: [name] is having significant vaginal dryness issues.  Otherwise she exercises about 4 times a week, [place]  She has not yet established herself with an oncologist.  She did establish herself with a primary care physician and they faxed her labs to [place] yesterday.   BREAST CANCER HISTORY: From the original intake note:  The patient had screening mammography at the breast Center 06/06/2010 showing a possible mass in the left  breast. Left diagnostic mammography and ultrasonography to 06/21/2010 confirmed an oval mass in the lower inner quadrant of the left breast which was not palpable. Ultrasound showed a 0.8 cm hypoechoic smoothly marginated mass. This was biopsied 08/09/2010, and showed [facility] [id number]) and invasive ductal carcinoma, high-grade, with a brisk lymphocytic reaction. HER-2/neu was not amplified, the signals ratio being 1.11. Estrogen receptor was 56% positive, with weak staining intensity. Progesterone receptor was negative. The proliferation marker was 97%.  On 08/15/2010 the patient underwent bilateral breast MRI, which showed a solitary mass in the left breast measuring 1.3 cm. On 08/29/2010 [name] underwent left lumpectomy and sentinel lymph node sampling, the final pathology, [id number], showing a high-grade invasive ductal carcinoma measuring 1.1 cm, with all 3 sentinel lymph nodes benign. Repeat HER-2 was again negative, with a ratio of 1.23.  Her subsequent history is as detailed below   PAST MEDICAL HISTORY: Past Medical History:  Diagnosis Date  . Breast cancer ([place]) 2012   left breast  . Breast cancer, female ([place]) 02/23/2011  . Cough   . Glaucoma   . Nasal congestion   . No pertinent past medical history   . Personal history of chemotherapy 2012  . Personal history of radiation therapy 2012    PAST SURGICAL HISTORY: Past Surgical History:  Procedure Laterality Date  . BREAST LUMPECTOMY Left 2012  . BREAST SURGERY    . LAPAROSCOPIC ENDOMETRIOSIS FULGURATION  2001  . PORT-A-CATH REMOVAL  05/29/2011   Procedure: MINOR REMOVAL PORT-A-CATH;  Surgeon: Heather Faster. Cornett, MD;  Location: [facility];  Service: General;;    FAMILY HISTORY No family  history on file. The patient's father died at the age of 57 and a single engine plane crash. The patient's mother is living, age 43. The patient has one half brother and one half sister. There is no history of breast or ovarian cancer  in the family. The patient did undergo genetic testing, which was negative for BRCA 1 or 2 gene mutations   GYNECOLOGIC HISTORY:  Patient's last menstrual period was 08/28/2010. Menarche age 68, first live birth age 47. The patient is GX P1. She status post right salpingo-oophorectomy for endometriosis remotely. 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  She was also one of our Her husband Heather Schaefer was Engineer, maintenance of operations for a carrier transport company.  They now have moved to [place].  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  Their 30 year old daughter, Heather Schaefer, currently lives in [place], [place] for a job.  The patient attends a [employer].    ADVANCED DIRECTIVES: In the absence of any documents to the contrary the patient's husband is her healthcare power of attorney   HEALTH MAINTENANCE: Social History   Tobacco Use  . Smoking status: Former Smoker    Quit date: 05/27/2001    Years since quitting: 18.7  Substance Use Topics  . Alcohol use: Yes    Comment: 1-2 week  . Drug use: No      Allergies  Allergen Reactions  . Hydrocodone Nausea And Vomiting    Current Outpatient Medications  Medication Sig Dispense Refill  . Biotin (BIOTIN 5000) 5 MG CAPS Take 1 capsule by mouth daily.    Marland Kitchen estradiol (ESTRING) 2 MG vaginal ring Place 2 mg vaginally every 3 (three) months. follow package directions 3 each 12  . latanoprost (XALATAN) 0.005 % ophthalmic solution Place 1 drop into both eyes at bedtime.   3  . tamoxifen (NOLVADEX) 20 MG tablet TAKE 1 TABLET(20 MG) BY MOUTH DAILY 90 tablet 4   No current facility-administered medications for this visit.    OBJECTIVE: Middle-aged white woman in no acute distress There were  no vitals filed for this visit.   There is no height or weight on file to calculate BMI.      Televisit  LAB RESULTS:  CMP     Component Value Date/Time   NA 142 06/06/2016 1225   K 3.8 06/06/2016 1225   CL 108 (H) 05/14/2012 1042   CO2 25 06/06/2016 1225   GLUCOSE 104 06/06/2016 1225   GLUCOSE 103 (H) 05/14/2012 1042   BUN 12.1 06/06/2016 1225   CREATININE 0.9 06/06/2016 1225   CALCIUM 9.5 06/06/2016 1225   PROT 6.9 06/06/2016 1225   ALBUMIN 3.8 06/06/2016 1225   AST 19 06/06/2016 1225   ALT 14 06/06/2016 1225   ALKPHOS 47 06/06/2016 1225   BILITOT 0.43 06/06/2016 1225   GFRNONAA >60 08/24/2010 1343   GFRAA  08/24/2010 1343    >60        The eGFR has been calculated using the MDRD equation. This calculation has not been validated in all clinical situations. eGFR's persistently <60 mL/min signify possible Chronic Kidney Disease.    I No results found for: SPEP  Lab Results  Component Value Date   WBC 5.6 06/06/2016  NEUTROABS 2.9 06/06/2016   HGB 13.4 06/06/2016   HCT 39.7 06/06/2016   MCV 90.7 06/06/2016   PLT 267 06/06/2016      Chemistry      Component Value Date/Time   NA 142 06/06/2016 1225   K 3.8 06/06/2016 1225   CL 108 (H) 05/14/2012 1042   CO2 25 06/06/2016 1225   BUN 12.1 06/06/2016 1225   CREATININE 0.9 06/06/2016 1225      Component Value Date/Time   CALCIUM 9.5 06/06/2016 1225   ALKPHOS 47 06/06/2016 1225   AST 19 06/06/2016 1225   ALT 14 06/06/2016 1225   BILITOT 0.43 06/06/2016 1225       Lab Results  Component Value Date   LABCA2 21 08/16/2010    No components found for: [id number]  No results for input(s): INR in the last 168 hours.  Urinalysis No results found for: COLORURINE   STUDIES: No results found.   ASSESSMENT: 54 y.o. BRCA negative [place], [place] woman status post left lumpectomy and sentinel lymph node sampling 08/29/2010 for a pT1c pN0, stage IA invasive ductal carcinoma, grade 3, estrogen receptor  positive, progesterone receptor and HER-2 negative, with an MIB-1 of 97%.  (1) Adjuvant chemotherapy with cyclophosphamide and doxorubicin in dose dense fashion x4 completed 11/21/2010, followed by paclitaxel weekly for 9 of 12 planned doses, interrupted because of neuropathy, completed 02/12/2011  (2) Adjuvant radiation to the left breast completed 05/07/2011  (3) Tamoxifen started February 2013--to be continued 10 years   PLAN: Nafisa is now 9-1/2 years out from definitive surgery for her breast cancer with no evidence of disease recurrence visit this is very favorable.  She is continuing on tamoxifen with minimal side effects and the plan is to continue that through February 2023.  She has vaginal dryness issues.  While she is on tamoxifen I have no problems with her receiving some vaginal estrogens, which I think will be very helpful to her.  I put in a prescription for the Estring and if that does not work or is not approved by insurance she will let me know.  Of course one she goes off tamoxifen I will will not be able to prescribe this for her  She is looking for an oncologist locally and tells me she has some "leads".  Once she does establish there she will let me know so I can fax information to her doctor.  Tentatively though I have scheduled her for repeat virtual visit in 1 year   Heather Cruel, MD   02/04/2020 2:48 PM  Oncology and Hematology [facility] [place], [place] [address] Tel. [phone]  Joylene Igo [phone]   I, Heather Schaefer, am acting as scribe for Dr. Virgie Schaefer. Chamara Dyck.  I, Heather Del MD, have reviewed the above documentation for accuracy and completeness, and I agree with the above.   *Total Encounter Time as defined by the Centers for Medicare and Medicaid Services includes, in addition to the face-to-face time of a patient visit (documented in the note above) non-face-to-face time: obtaining and reviewing outside  history, ordering and reviewing medications, tests or procedures, care coordination (communications with other health care professionals or caregivers) and documentation in the medical record.

## 2020-02-09 ENCOUNTER — Telehealth: Payer: Self-pay | Admitting: *Deleted

## 2020-02-09 ENCOUNTER — Encounter: Payer: Self-pay | Admitting: Oncology

## 2020-02-09 NOTE — Telephone Encounter (Signed)
02/05/2020 request for coverage of Estring is denied per 02/08/2020 fax received today.   Estring (Drug tier class 3) is not on member's formulary. Is approved when ALL alternative medications on members formulary have been tried, did not work or were not tolerated.  Formulary alternative medication list.  Includes Premarin vaginal cream (Drug tier Class, 2), brand Vagifem (Class 3).

## 2020-02-10 ENCOUNTER — Other Ambulatory Visit: Payer: Self-pay

## 2020-02-11 ENCOUNTER — Other Ambulatory Visit: Payer: Self-pay

## 2020-02-11 DIAGNOSIS — Z171 Estrogen receptor negative status [ER-]: Secondary | ICD-10-CM

## 2020-02-11 DIAGNOSIS — C50312 Malignant neoplasm of lower-inner quadrant of left female breast: Secondary | ICD-10-CM

## 2020-02-15 ENCOUNTER — Other Ambulatory Visit: Payer: Self-pay

## 2020-02-15 MED ORDER — ESTRADIOL 0.1 MG/GM VA CREA: 1.0000 | TOPICAL_CREAM | VAGINAL | 0 refills | Status: DC

## 2020-02-16 ENCOUNTER — Other Ambulatory Visit: Payer: Self-pay | Admitting: Oncology

## 2020-02-16 MED ORDER — ESTRADIOL 0.1 MG/GM VA CREA: 1.0000 | TOPICAL_CREAM | VAGINAL | 0 refills | Status: DC

## 2020-02-23 ENCOUNTER — Encounter: Payer: Self-pay | Admitting: Oncology

## 2020-02-24 ENCOUNTER — Other Ambulatory Visit: Payer: Self-pay | Admitting: *Deleted

## 2020-02-24 MED ORDER — ESTRADIOL 0.1 MG/GM VA CREA: 1.0000 | TOPICAL_CREAM | VAGINAL | 1 refills | Status: DC

## 2020-04-26 ENCOUNTER — Encounter: Payer: Self-pay | Admitting: Oncology

## 2020-05-03 ENCOUNTER — Other Ambulatory Visit: Payer: Self-pay | Admitting: Oncology

## 2020-09-02 ENCOUNTER — Encounter: Payer: Self-pay | Admitting: Oncology

## 2020-09-05 ENCOUNTER — Encounter: Payer: Self-pay | Admitting: Oncology

## 2020-09-06 ENCOUNTER — Telehealth: Payer: Self-pay | Admitting: Oncology

## 2020-09-06 NOTE — Telephone Encounter (Signed)
Scheduled appt per 5/13 sch msg. Called pt, no answer. Left msg with appt date and time.

## 2020-10-28 ENCOUNTER — Encounter: Payer: Self-pay | Admitting: Oncology

## 2021-01-29 NOTE — Progress Notes (Signed)
[place]  Telephone:(336) [phone] Fax:(336) [phone]   ID: Heather Schaefer DOB: 15-Mar-1966  MR#: [id number]  QXI#:[id number]  Patient Care Team: Pcp, No as PCP - General Heather Schaefer, Heather Dad, MD as Consulting Physician (Oncology) OTHER MD: Heather Murphy, PA, [facility] [address] [place] [address]   CHIEF COMPLAINT: Estrogen receptor positive breast cancer  CURRENT TREATMENT: Tamoxifen   INTERVAL HISTORY: Heather Schaefer returns today for follow-up of her estrogen receptor positive breast cancer.    She continues on tamoxifen.  Hot flashes are not a major issue.  She is using Estrace vaginal cream with some success undercover of tamoxifen.  Otherwise (she ran out a few months ago and has not refilled it) she has a feeling like she has a bladder infection and also pain with intercourse.  REVIEW OF SYSTEMS: Heather Schaefer enjoys living in [place] during the summer and then in the winter she and her husband have an RV and travel for a month at a time, usually in the [place].  When home she goes to the gym at least 4 times a week.  A detailed review of systems today was otherwise stable.   COVID 19 VACCINATION STATUS: Status post vaccine x2.  Thinks she also may have had COVID in 2019.   BREAST CANCER HISTORY: From the original intake note:  The patient had screening mammography at the breast Center 06/06/2010 showing a possible mass in the left breast. Left diagnostic mammography and ultrasonography to 06/21/2010 confirmed an oval mass in the lower inner quadrant of the left breast which was not palpable. Ultrasound showed a 0.8 cm hypoechoic smoothly marginated mass. This was biopsied 08/09/2010, and showed Heather Schaefer) and invasive ductal carcinoma, high-grade, with a brisk lymphocytic reaction. HER-2/neu was not amplified, the signals ratio being 1.11. Estrogen receptor was 56% positive, with weak staining intensity. Progesterone receptor was negative. The  proliferation marker was 97%.  On 08/15/2010 the patient underwent bilateral breast MRI, which showed a solitary mass in the left breast measuring 1.3 cm. On 08/29/2010 Heather Schaefer underwent left lumpectomy and sentinel lymph node sampling, the final pathology, Heather Schaefer, showing a high-grade invasive ductal carcinoma measuring 1.1 cm, with all 3 sentinel lymph nodes benign. Repeat HER-2 was again negative, with a ratio of 1.23.  Her subsequent history is as detailed below   PAST MEDICAL HISTORY: Past Medical History:  Diagnosis Date   Breast cancer ([place]) 2012   left breast   Breast cancer, female ([place]) 02/23/2011   Cough    Glaucoma    Nasal congestion    No pertinent past medical history    Personal history of chemotherapy 2012   Personal history of radiation therapy 2012    PAST SURGICAL HISTORY: Past Surgical History:  Procedure Laterality Date   BREAST LUMPECTOMY Left 2012   BREAST SURGERY     LAPAROSCOPIC ENDOMETRIOSIS FULGURATION  2001   PORT-A-CATH REMOVAL  05/29/2011   Procedure: MINOR REMOVAL PORT-A-CATH;  Surgeon: Joyice Faster. Cornett, MD;  Location: [facility];  Service: General;;    FAMILY HISTORY No family history on file. The patient's father died at the age of 21 and a single engine plane crash. The patient's mother is living, age 66. The patient has one half brother and one half sister. There is no history of breast or ovarian cancer in the family. The patient did undergo genetic testing, which was negative for BRCA 1 or 2 gene mutations   GYNECOLOGIC HISTORY:  Patient's last menstrual period  was 08/28/2010. Menarche age 72, first live birth age 83. The patient is GX P1. She status post right salpingo-oophorectomy for endometriosis remotely. 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  Her husband Heather Schaefer was Engineer, maintenance of operations for a carrier transport company.  They now have moved to [place].  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  Their 41 year old daughter, Heather Schaefer, currently lives in [place], [place] for a job.  The patient attends a [employer].    ADVANCED DIRECTIVES: In the absence of any documents to the contrary the patient's husband is her healthcare power of attorney   HEALTH MAINTENANCE: Social History   Tobacco Use   Smoking status: Former    Types: Cigarettes    Quit date: 05/27/2001    Years since quitting: 19.6  Substance Use Topics   Alcohol use: Yes    Comment: 1-2 week   Drug use: No      Allergies  Allergen Reactions   Hydrocodone Nausea And Vomiting    Current Outpatient Medications  Medication Sig Dispense Refill   Biotin (BIOTIN 5000) 5 MG CAPS Take 1 capsule by mouth daily.     estradiol (ESTRACE) 0.1 MG/GM vaginal cream INSERT 1 APPLICATORFUL VAGINALLY 3 TIMES A WEEK 42.5 g 1   latanoprost (XALATAN) 0.005 % ophthalmic solution Place 1 drop into both eyes at bedtime.   3   tamoxifen (NOLVADEX) 20 MG tablet TAKE 1 TABLET(20 MG) BY MOUTH DAILY 90 tablet 4   No current facility-administered medications for this visit.    OBJECTIVE: White woman who appears younger than stated age 36:   01/30/21 1306  BP: 133/83  Pulse: 78  Resp: 18  Temp: 98.1 F (36.7 C)  SpO2: 100%     There is no height or weight on file to calculate BMI.      Sclerae unicteric, EOMs intact Oropharynx clear and moist No cervical or supraclavicular adenopathy Lungs no rales or rhonchi Heart regular rate and rhythm Abd soft, nontender, positive bowel sounds MSK no focal spinal tenderness, no upper extremity lymphedema Neuro: nonfocal, well oriented, appropriate affect Breasts: The right breast is benign per the left breast is status postlumpectomy and radiation.  There is  no evidence of local recurrence.  Both axillae are benign   LAB RESULTS:  CMP     Component Value Date/Time   NA 142 06/06/2016 1225   K 3.8 06/06/2016 1225   CL 108 (H) 05/14/2012 1042   CO2 25 06/06/2016 1225   GLUCOSE 104 06/06/2016 1225   GLUCOSE 103 (H) 05/14/2012 1042   BUN 12.1 06/06/2016 1225   CREATININE 0.9 06/06/2016 1225   CALCIUM 9.5 06/06/2016 1225   PROT 6.9 06/06/2016 1225   ALBUMIN 3.8 06/06/2016 1225   AST 19 06/06/2016 1225   ALT 14 06/06/2016 1225   ALKPHOS 47 06/06/2016 1225   BILITOT 0.43 06/06/2016 1225   GFRNONAA >60 08/24/2010 1343   GFRAA  08/24/2010 1343    >60        The eGFR has been calculated using the MDRD equation. This calculation has not been validated in all clinical situations. eGFR's persistently <60 mL/min signify possible Chronic Kidney Disease.    I No results found for: SPEP  Lab  Results  Component Value Date   WBC 5.6 06/06/2016   NEUTROABS 2.9 06/06/2016   HGB 13.4 06/06/2016   HCT 39.7 06/06/2016   MCV 90.7 06/06/2016   PLT 267 06/06/2016      Chemistry      Component Value Date/Time   NA 142 06/06/2016 1225   K 3.8 06/06/2016 1225   CL 108 (H) 05/14/2012 1042   CO2 25 06/06/2016 1225   BUN 12.1 06/06/2016 1225   CREATININE 0.9 06/06/2016 1225      Component Value Date/Time   CALCIUM 9.5 06/06/2016 1225   ALKPHOS 47 06/06/2016 1225   AST 19 06/06/2016 1225   ALT 14 06/06/2016 1225   BILITOT 0.43 06/06/2016 1225       Lab Results  Component Value Date   LABCA2 21 08/16/2010    No components found for: CLEXN170  No results for input(s): INR in the last 168 hours.  Urinalysis No results found for: COLORURINE   STUDIES: No results found.   ASSESSMENT: 55 y.o. BRCA negative [place], [place] woman status post left lumpectomy and sentinel lymph node sampling 08/29/2010 for a pT1c pN0, stage IA invasive ductal carcinoma, grade 3, estrogen receptor positive, progesterone receptor and HER-2  negative, with an MIB-1 of 97%.  (1) Adjuvant chemotherapy with cyclophosphamide and doxorubicin in dose dense fashion x4 completed 11/21/2010, followed by paclitaxel weekly for 9 of 12 planned doses, interrupted because of neuropathy, completed 02/12/2011  (2) Adjuvant radiation to the left breast completed 05/07/2011  (3) Tamoxifen started February 2013--completing 10 years late 2022   PLAN: Tremaine is now 10-1/2 years out from definitive surgery for her breast cancer with no evidence of disease recurrence.  This is very favorable.  She will complete 10 years of tamoxifen later this year.  She understands she does not need to taper off that medication.  She is not sure if she wants to continue vaginal Estrace.  She understands that while she is on tamoxifen I feel very comfortable but that when she goes off tamoxifen as we are planning I simply do not have data regarding safety.  It may be completely safe, it may be minimally risky, it may be moderately risky, but we cannot make any significant statements since the data is simply not there.  She has an appointment later this month with her gynecologist in [place] and will be discussing these issues with her.  If Davis decides that she would like to continue tamoxifen at least as long as she is using the vaginal estrogen she will call us and we will be glad to refill that for her  Otherwise I am comfortable with her "graduation" today.  All she will need in terms of breast cancer follow-up is a yearly mammography and a yearly physician breast exam which her gynecologist in [place] of course can do.  I will be glad to see Catrena again at any point in the future if and when the need arises but as of now are making no further routine appointments for her here  Total encounter time 25 minutes.Chauncey Cruel, MD   01/30/2021 1:11 PM  Oncology and Hematology [facility] [place], [place] [address] Tel.  [phone]  Joylene Igo [phone]   I, Wilburn Mylar, am acting as scribe for Dr. Virgie Schaefer. Raiana Pharris.  I, Lurline Del MD, have reviewed the above documentation for accuracy and completeness, and I agree with the above.   *Total Encounter Time  as defined by the Centers for Medicare and Medicaid Services includes, in addition to the face-to-face time of a patient visit (documented in the note above) non-face-to-face time: obtaining and reviewing outside history, ordering and reviewing medications, tests or procedures, care coordination (communications with other health care professionals or caregivers) and documentation in the medical record.

## 2021-01-30 ENCOUNTER — Other Ambulatory Visit: Payer: Self-pay

## 2021-01-30 ENCOUNTER — Inpatient Hospital Stay: Payer: BC Managed Care – PPO | Attending: Oncology | Admitting: Oncology

## 2021-01-30 VITALS — BP 133/83 | HR 78 | Temp 98.1°F | Resp 18

## 2021-01-30 DIAGNOSIS — Z79810 Long term (current) use of selective estrogen receptor modulators (SERMs): Secondary | ICD-10-CM | POA: Insufficient documentation

## 2021-01-30 DIAGNOSIS — Z9079 Acquired absence of other genital organ(s): Secondary | ICD-10-CM | POA: Insufficient documentation

## 2021-01-30 DIAGNOSIS — Z9221 Personal history of antineoplastic chemotherapy: Secondary | ICD-10-CM | POA: Insufficient documentation

## 2021-01-30 DIAGNOSIS — C50312 Malignant neoplasm of lower-inner quadrant of left female breast: Secondary | ICD-10-CM | POA: Diagnosis present

## 2021-01-30 DIAGNOSIS — Z87891 Personal history of nicotine dependence: Secondary | ICD-10-CM | POA: Diagnosis not present

## 2021-01-30 DIAGNOSIS — Z170 Estrogen receptor positive status [ER+]: Secondary | ICD-10-CM | POA: Diagnosis not present

## 2021-01-30 DIAGNOSIS — Z171 Estrogen receptor negative status [ER-]: Secondary | ICD-10-CM

## 2021-01-30 DIAGNOSIS — Z923 Personal history of irradiation: Secondary | ICD-10-CM | POA: Insufficient documentation

## 2021-01-30 DIAGNOSIS — Z90722 Acquired absence of ovaries, bilateral: Secondary | ICD-10-CM | POA: Insufficient documentation

## 2021-02-14 ENCOUNTER — Encounter: Payer: Self-pay | Admitting: Oncology

## 2023-07-26 ENCOUNTER — Other Ambulatory Visit: Payer: Self-pay

## 2023-07-26 DIAGNOSIS — Z1231 Encounter for screening mammogram for malignant neoplasm of breast: Secondary | ICD-10-CM

## 2024-03-24 ENCOUNTER — Other Ambulatory Visit: Payer: Self-pay

## 2024-03-24 DIAGNOSIS — Z1231 Encounter for screening mammogram for malignant neoplasm of breast: Secondary | ICD-10-CM

## 2024-03-30 ENCOUNTER — Inpatient Hospital Stay
Admission: RE | Admit: 2024-03-30 | Discharge: 2024-03-30 | Payer: PRIVATE HEALTH INSURANCE | Attending: Obstetrics and Gynecology | Admitting: Obstetrics and Gynecology

## 2024-03-30 DIAGNOSIS — Z1231 Encounter for screening mammogram for malignant neoplasm of breast: Secondary | ICD-10-CM
